# Patient Record
Sex: Female | Born: 2000 | Race: Black or African American | Hispanic: No | Marital: Single | Smoking: Current every day smoker
Health system: Southern US, Community
[De-identification: ages and names within clinical notes are randomized; demographics above are authoritative.]

## PROBLEM LIST (undated history)

## (undated) DIAGNOSIS — F32A Depression, unspecified: Secondary | ICD-10-CM

## (undated) HISTORY — DX: Depression, unspecified: F32.A

---
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
## 2018-10-21 ENCOUNTER — Other Ambulatory Visit: Payer: Self-pay

## 2018-10-21 ENCOUNTER — Ambulatory Visit (HOSPITAL_COMMUNITY)
Admission: EM | Admit: 2018-10-21 | Discharge: 2018-10-21 | Disposition: A | Discharge status: Home / Self Care | Payer: Medicaid Other | Attending: Emergency Medicine | Admitting: Emergency Medicine

## 2018-10-21 ENCOUNTER — Encounter (HOSPITAL_COMMUNITY): Payer: Self-pay

## 2018-10-21 DIAGNOSIS — R220 Localized swelling, mass and lump, head: Secondary | ICD-10-CM

## 2018-10-21 DIAGNOSIS — T7840XA Allergy, unspecified, initial encounter: Secondary | ICD-10-CM

## 2018-10-21 MED ORDER — PREDNISONE 10 MG (21) PO TBPK: ORAL_TABLET | Freq: Every day | ORAL | 0 refills | Status: DC

## 2018-10-21 MED ORDER — DIPHENHYDRAMINE HCL 25 MG PO TABS: 25.0000 mg | ORAL_TABLET | Freq: Four times a day (QID) | ORAL | 0 refills | Status: DC | PRN

## 2018-10-21 NOTE — ED Triage Notes (Signed)
Pt states her bottom lip has been swelling for 2 days. Pt states her mouth has been dry as well.

## 2018-10-21 NOTE — ED Provider Notes (Signed)
[facility]    CSN: [id number] Arrival date & time: 10/21/18  1420      History   Chief Complaint Chief Complaint  Patient presents with  . Oral Swelling    HPI Laura Parker is a 18 y.o. female.   Patient presents with swelling to her lower lip x2 days.  She denies difficulty swallowing or breathing.  She denies new lip products but states she does have a new facemask.  She denies rash, fever, chills, sore throat, shortness of breath, or other symptoms.  LMP: 09/27/2018.     The history is provided by the patient.    History reviewed. No pertinent past medical history.  There are no active problems to display for this patient.   History reviewed. No pertinent surgical history.  OB History   No obstetric history on file.      Home Medications    Prior to Admission medications   Medication Sig Start Date End Date Taking? Authorizing Provider  diphenhydrAMINE (BENADRYL) 25 MG tablet Take 1 tablet (25 mg total) by mouth every 6 (six) hours as needed. 10/21/18   Mickie Bail, NP  predniSONE (STERAPRED UNI-PAK 21 TAB) 10 MG (21) TBPK tablet Take by mouth daily. Take 6 tabs by mouth daily  for 1 day, then 5 tabs for 1 day, then 4 tabs for 1 day, then 3 tabs for 1 day, 2 tabs for 1 day, then 1 tab by mouth daily for 1 day 10/21/18   Mickie Bail, NP    Family History No family history on file.  Social History Social History   Tobacco Use  . Smoking status: Never Smoker  . Smokeless tobacco: Never Used  Substance Use Topics  . Alcohol use: Never    Frequency: Never  . Drug use: Never     Allergies   Patient has no known allergies.   Review of Systems Review of Systems  Constitutional: Negative for chills and fever.  HENT: Negative for ear pain, sore throat and trouble swallowing.   Eyes: Negative for pain and visual disturbance.  Respiratory: Negative for cough and shortness of breath.   Cardiovascular: Negative for chest pain and  palpitations.  Gastrointestinal: Negative for abdominal pain and vomiting.  Genitourinary: Negative for dysuria and hematuria.  Musculoskeletal: Negative for arthralgias and back pain.  Skin: Negative for color change and rash.  Neurological: Negative for seizures and syncope.  All other systems reviewed and are negative.    Physical Exam Triage Vital Signs ED Triage Vitals  Enc Vitals Group     BP 10/21/18 1508 106/66     Pulse Rate 10/21/18 1508 81     Resp 10/21/18 1508 16     Temp 10/21/18 1508 97.6 F (36.4 C)     Temp Source 10/21/18 1508 Oral     SpO2 10/21/18 1508 100 %     Weight 10/21/18 1506 120 lb (54.4 kg)     Height --      Head Circumference --      Peak Flow --      Pain Score 10/21/18 1506 7     Pain Loc --      Pain Edu? --      Excl. in GC? --    No data found.  Updated Vital Signs BP 106/66 (BP Location: Right Arm)   Pulse 81   Temp 97.6 F (36.4 C) (Oral)   Resp 16   Wt 120 lb (54.4 kg)  LMP 09/27/2018   SpO2 100%   Visual Acuity Right Eye Distance:   Left Eye Distance:   Bilateral Distance:    Right Eye Near:   Left Eye Near:    Bilateral Near:     Physical Exam Vitals signs and nursing note reviewed.  Constitutional:      General: She is not in acute distress.    Appearance: She is well-developed.  HENT:     Head: Normocephalic and atraumatic.     Nose: Nose normal.     Mouth/Throat:     Mouth: Mucous membranes are moist.     Pharynx: Oropharynx is clear.     Comments: Minor lower lip edema.  No difficulty swallowing.   Eyes:     Conjunctiva/sclera: Conjunctivae normal.  Neck:     Musculoskeletal: Neck supple.  Cardiovascular:     Rate and Rhythm: Normal rate and regular rhythm.     Heart sounds: No murmur.  Pulmonary:     Effort: Pulmonary effort is normal. No respiratory distress.     Breath sounds: Normal breath sounds. No stridor. No wheezing or rhonchi.     Comments: Good air movement.  Abdominal:     Palpations:  Abdomen is soft.     Tenderness: There is no abdominal tenderness.  Skin:    General: Skin is warm and dry.     Findings: No rash.  Neurological:     Mental Status: She is alert.      UC Treatments / Results  Labs (all labs ordered are listed, but only abnormal results are displayed) Labs Reviewed - No data to display  EKG   Radiology No results found.  Procedures Procedures (including critical care time)  Medications Ordered in UC Medications - No data to display  Initial Impression / Assessment and Plan / UC Course  I have reviewed the triage vital signs and the nursing notes.  Pertinent labs & imaging results that were available during my care of the patient were reviewed by me and considered in my medical decision making (see chart for details).     Lip swelling which is likely an allergic reaction to her new facemask.  Treating with prednisone and Benadryl.  Strict instructions given to patient to go to the emergency department if she has difficulty swallowing or breathing; or feels that her mouth or throat is swelling.  Instructed patient to return here if her swelling persist and is not improving with medications.  Patient agrees to plan of care.     Final Clinical Impressions(s) / UC Diagnoses   Final diagnoses:  Allergic reaction, initial encounter  Lip swelling     Discharge Instructions     Take the prednisone and Benadryl as directed.    Go to the emergency department if you feel like your mouth or throat is swelling;  Or if you have difficulty swallowing or difficulty breathing.    Return here if the swelling persists and is not improving with the medications.        ED Prescriptions    Medication Sig Dispense Auth. Provider   predniSONE (STERAPRED UNI-PAK 21 TAB) 10 MG (21) TBPK tablet Take by mouth daily. Take 6 tabs by mouth daily  for 1 day, then 5 tabs for 1 day, then 4 tabs for 1 day, then 3 tabs for 1 day, 2 tabs for 1 day, then 1 tab by  mouth daily for 1 day 21 tablet Mickie Bailate, Farha Dano H, NP   diphenhydrAMINE (BENADRYL)  25 MG tablet Take 1 tablet (25 mg total) by mouth every 6 (six) hours as needed. 30 tablet Sharion Balloon, NP     Controlled Substance Prescriptions [place] Controlled Substance Registry consulted? Not Applicable   Sharion Balloon, NP 10/21/18 [phone]

## 2018-10-21 NOTE — Discharge Instructions (Signed)
Take the prednisone and Benadryl as directed.    Go to the emergency department if you feel like your mouth or throat is swelling;  Or if you have difficulty swallowing or difficulty breathing.    Return here if the swelling persists and is not improving with the medications.

## 2019-09-06 ENCOUNTER — Ambulatory Visit (HOSPITAL_COMMUNITY)
Admission: EM | Admit: 2019-09-06 | Discharge: 2019-09-06 | Disposition: A | Discharge status: Home / Self Care | Payer: Medicaid Other | Attending: Family Medicine | Admitting: Family Medicine

## 2019-09-06 ENCOUNTER — Encounter (HOSPITAL_COMMUNITY): Payer: Self-pay

## 2019-09-06 ENCOUNTER — Other Ambulatory Visit: Payer: Self-pay

## 2019-09-06 DIAGNOSIS — B9789 Other viral agents as the cause of diseases classified elsewhere: Secondary | ICD-10-CM

## 2019-09-06 DIAGNOSIS — Z20822 Contact with and (suspected) exposure to covid-19: Secondary | ICD-10-CM | POA: Diagnosis not present

## 2019-09-06 DIAGNOSIS — R519 Headache, unspecified: Secondary | ICD-10-CM | POA: Diagnosis not present

## 2019-09-06 DIAGNOSIS — R05 Cough: Secondary | ICD-10-CM | POA: Diagnosis not present

## 2019-09-06 DIAGNOSIS — B349 Viral infection, unspecified: Secondary | ICD-10-CM | POA: Insufficient documentation

## 2019-09-06 DIAGNOSIS — F17210 Nicotine dependence, cigarettes, uncomplicated: Secondary | ICD-10-CM | POA: Insufficient documentation

## 2019-09-06 DIAGNOSIS — J028 Acute pharyngitis due to other specified organisms: Secondary | ICD-10-CM | POA: Insufficient documentation

## 2019-09-06 DIAGNOSIS — R5383 Other fatigue: Secondary | ICD-10-CM | POA: Diagnosis not present

## 2019-09-06 LAB — POCT RAPID STREP A: Streptococcus, Group A Screen (Direct): NEGATIVE

## 2019-09-06 LAB — SARS CORONAVIRUS 2 (TAT 6-24 HRS): SARS Coronavirus 2: NEGATIVE

## 2019-09-06 NOTE — ED Provider Notes (Signed)
[facility]   [id number] 09/06/19 Arrival Time: 1009   CC: COVID symptoms  SUBJECTIVE: History from: patient.  Laura Parker is a 19 y.o. female who presents with abrupt onset of headache and sore throat for the last 3 days. Denies sick exposure to COVID, flu or strep. Does report that her sister has tonsillitis at home. Denies Covid history, has not had Covid vaccines. Denies recent travel. Has not taken OTC medications for this. There are no aggravating or alleviating factors. Denies previous symptoms in the past. Denies fever, chills, fatigue, sinus pain, rhinorrhea, SOB, wheezing, chest pain, nausea, changes in bowel or bladder habits.    ROS: As per HPI.  All other pertinent ROS negative.     History reviewed. No pertinent past medical history. History reviewed. No pertinent surgical history. No Known Allergies No current facility-administered medications on file prior to encounter.   Current Outpatient Medications on File Prior to Encounter  Medication Sig Dispense Refill  . ibuprofen (ADVIL) 200 MG tablet Take 200 mg by mouth every 6 (six) hours as needed for mild pain or moderate pain.    . diphenhydrAMINE (BENADRYL) 25 MG tablet Take 1 tablet (25 mg total) by mouth every 6 (six) hours as needed. 30 tablet 0  . predniSONE (STERAPRED UNI-PAK 21 TAB) 10 MG (21) TBPK tablet Take by mouth daily. Take 6 tabs by mouth daily  for 1 day, then 5 tabs for 1 day, then 4 tabs for 1 day, then 3 tabs for 1 day, 2 tabs for 1 day, then 1 tab by mouth daily for 1 day 21 tablet 0   Social History   Socioeconomic History  . Marital status: Single    Spouse name: Not on file  . Number of children: Not on file  . Years of education: Not on file  . Highest education level: Not on file  Occupational History  . Not on file  Tobacco Use  . Smoking status: Current Every Day Smoker    Types: Cigars  . Smokeless tobacco: Never Used  Vaping Use  . Vaping Use: Never used    Substance and Sexual Activity  . Alcohol use: Never  . Drug use: Never  . Sexual activity: Not Currently  Other Topics Concern  . Not on file  Social History Narrative  . Not on file   Social Determinants of Health   Financial Resource Strain:   . Difficulty of Paying Living Expenses:   Food Insecurity:   . Worried About Programme researcher, broadcasting/film/video in the Last Year:   . Barista in the Last Year:   Transportation Needs:   . Freight forwarder (Medical):   Marland Kitchen Lack of Transportation (Non-Medical):   Physical Activity:   . Days of Exercise per Week:   . Minutes of Exercise per Session:   Stress:   . Feeling of Stress :   Social Connections:   . Frequency of Communication with Friends and Family:   . Frequency of Social Gatherings with Friends and Family:   . Attends Religious Services:   . Active Member of Clubs or Organizations:   . Attends Banker Meetings:   Marland Kitchen Marital Status:   Intimate Partner Violence:   . Fear of Current or Ex-Partner:   . Emotionally Abused:   Marland Kitchen Physically Abused:   . Sexually Abused:    History reviewed. No pertinent family history.  OBJECTIVE:  Vitals:   09/06/19 1058  BP: (!) 108/63  Pulse: 74  Resp: 16  Temp: 98.3 F (36.8 C)  TempSrc: Oral  SpO2: 100%     General appearance: alert; appears fatigued, but nontoxic; speaking in full sentences and tolerating own secretions HEENT: NCAT; Ears: EACs clear, TMs pearly gray; Eyes: PERRL.  EOM grossly intact. Sinuses: nontender; Nose: nares patent without rhinorrhea, Throat: oropharynx clear, tonsils erythematous +1, no exudates, uvula midline  Neck: supple without LAD Lungs: unlabored respirations, symmetrical air entry; cough: absent; no respiratory distress; CTAB Heart: regular rate and rhythm.  Radial pulses 2+ symmetrical bilaterally Skin: warm and dry Psychological: alert and cooperative; normal mood and affect  LABS:  Results for orders placed or performed during  the hospital encounter of 09/06/19 (from the past 24 hour(s))  POCT rapid strep A [facility])     Status: None   Collection Time: 09/06/19 11:32 AM  Result Value Ref Range   Streptococcus, Group A Screen (Direct) NEGATIVE NEGATIVE     ASSESSMENT & PLAN:  1. Viral illness   2. Nonintractable headache, unspecified chronicity pattern, unspecified headache type   3. Sore throat (viral)   4. Other fatigue    Rapid strep negative. Will culture. Will inform of positive results and treat accordingly   COVID testing ordered.  It will take between 1-2 days for test results.  Someone will contact you regarding abnormal results.    Patient should remain in quarantine until they have received Covid results.  If negative you may resume normal activities (go back to work/school) while practicing hand hygiene, social distance, and mask wearing.  If positive, patient should remain in quarantine for 10 days from symptom onset AND greater than 72 hours after symptoms resolution (absence of fever without the use of fever-reducing medication and improvement in respiratory symptoms), whichever is longer Get plenty of rest and push fluids Use OTC zyrtec for nasal congestion, runny nose, and/or sore throat Use OTC flonase for nasal congestion and runny nose Use medications daily for symptom relief Use OTC medications like ibuprofen or tylenol as needed fever or pain Call or go to the ED if you have any new or worsening symptoms such as fever, worsening cough, shortness of breath, chest tightness, chest pain, turning blue, changes in mental status.  Reviewed expectations re: course of current medical issues. Questions answered. Outlined signs and symptoms indicating need for more acute intervention. Patient verbalized understanding. After Visit Summary given.         Moshe Cipro, NP 09/06/19 1140

## 2019-09-06 NOTE — Discharge Instructions (Signed)
Your rapid strep test is negative.  A throat culture is pending; we will call you if it is positive requiring treatment.    Your COVID test is pending.  You should self quarantine until the test result is back.    Take Tylenol as needed for fever or discomfort.  Rest and keep yourself hydrated.    Go to the emergency department if you develop acute worsening symptoms.     

## 2019-09-06 NOTE — ED Triage Notes (Signed)
Patient is here today with complaints of a headache(x1 week), non productive cough(x 2 days) and scratchy throat(x2 days). Patient states she shares a bedroom with a younger sister that has been Dx tonsillitis or Strep Throat. Patient states she has tried OTC Advil and gargling with warm salt water with no relief,

## 2019-09-08 LAB — CULTURE, GROUP A STREP (THRC)

## 2020-02-15 ENCOUNTER — Encounter (HOSPITAL_COMMUNITY): Payer: Self-pay

## 2020-02-15 ENCOUNTER — Ambulatory Visit (HOSPITAL_COMMUNITY)
Admission: EM | Admit: 2020-02-15 | Discharge: 2020-02-15 | Disposition: A | Discharge status: Home / Self Care | Payer: Medicaid Other | Attending: Family Medicine | Admitting: Family Medicine

## 2020-02-15 ENCOUNTER — Other Ambulatory Visit: Payer: Self-pay

## 2020-02-15 DIAGNOSIS — Z20822 Contact with and (suspected) exposure to covid-19: Secondary | ICD-10-CM

## 2020-02-15 DIAGNOSIS — J069 Acute upper respiratory infection, unspecified: Secondary | ICD-10-CM | POA: Insufficient documentation

## 2020-02-15 DIAGNOSIS — U071 COVID-19: Secondary | ICD-10-CM | POA: Diagnosis not present

## 2020-02-15 LAB — SARS CORONAVIRUS 2 (TAT 6-24 HRS): SARS Coronavirus 2: POSITIVE — AB

## 2020-02-15 NOTE — ED Provider Notes (Signed)
  [facility]   [id number] 02/15/20 Arrival Time: 1112  ASSESSMENT & PLAN:  1. Acute upper respiratory infection   2. Exposure to COVID-19 virus     Overall feeling better. COVID-19 testing sent. See letter/work note on file for self-isolation guidelines. OTC symptom care as needed.    Follow-up Information     Urgent Care at [facility].   Specialty: Urgent Care Why: As needed. Contact information: [address] [place] [address] [phone]              Reviewed expectations re: course of current medical issues. Questions answered. Outlined signs and symptoms indicating need for more acute intervention. Understanding verbalized. After Visit Summary given.   SUBJECTIVE: History from: patient. Laura Parker is a 20 y.o. female who presents with worries regarding COVID-19. Known COVID-19 contact: reports recent exposure. Recent travel: none. Reports: HA, fatigue. Initially with n/v but that has resolved. Nasal congestion continuing.  Denies: fever and difficulty breathing. Normal PO intake without n/v/d.    OBJECTIVE:  Vitals:   02/15/20 1203  BP: 135/80  Pulse: 82  Resp: 17  Temp: 98.5 F (36.9 C)  TempSrc: Oral  SpO2: 100%    General appearance: alert; no distress Eyes: PERRLA; EOMI; conjunctiva normal HENT: [place]; AT; with nasal congestion Neck: supple  Lungs: speaks full sentences without difficulty; unlabored Extremities: no edema Skin: warm and dry Neurologic: normal gait Psychological: alert and cooperative; normal mood and affect  Labs:  Labs Reviewed  SARS CORONAVIRUS 2 (TAT 6-24 HRS)     No Known Allergies  History reviewed. No pertinent past medical history. Social History   Socioeconomic History  . Marital status: Single    Spouse name: Not on file  . Number of children: Not on file  . Years of education: Not on file  . Highest education level: Not on file  Occupational History  .  Not on file  Tobacco Use  . Smoking status: Current Every Day Smoker    Types: Cigars  . Smokeless tobacco: Never Used  Vaping Use  . Vaping Use: Never used  Substance and Sexual Activity  . Alcohol use: Never  . Drug use: Never  . Sexual activity: Not Currently  Other Topics Concern  . Not on file  Social History Narrative  . Not on file   Social Determinants of Health   Financial Resource Strain: Not on file  Food Insecurity: Not on file  Transportation Needs: Not on file  Physical Activity: Not on file  Stress: Not on file  Social Connections: Not on file  Intimate Partner Violence: Not on file   History reviewed. No pertinent family history. History reviewed. No pertinent surgical history.   Mardella Layman, MD 02/15/20 1344

## 2020-02-15 NOTE — ED Triage Notes (Signed)
Pt I with c/o emesis, fatigue and headache that started on 12/31, but has now resolved  States she has cough and st now  Pt has had tylenol for sxs with some relief

## 2020-02-15 NOTE — Discharge Instructions (Signed)
You have been tested for COVID-19 today. °If your test returns positive, you will receive a phone call from [facility] regarding your results. °Negative test results are not called. °Both positive and negative results area always visible on MyChart. °If you do not have a MyChart account, sign up instructions are provided in your discharge papers. °Please do not hesitate to contact us should you have questions or concerns. ° °

## 2020-04-19 ENCOUNTER — Ambulatory Visit (HOSPITAL_COMMUNITY): Payer: Self-pay

## 2020-04-21 ENCOUNTER — Ambulatory Visit (HOSPITAL_COMMUNITY)
Admission: EM | Admit: 2020-04-21 | Discharge: 2020-04-21 | Disposition: A | Discharge status: Home / Self Care | Payer: Medicaid Other | Attending: Emergency Medicine | Admitting: Emergency Medicine

## 2020-04-21 ENCOUNTER — Encounter (HOSPITAL_COMMUNITY): Payer: Self-pay

## 2020-04-21 ENCOUNTER — Other Ambulatory Visit: Payer: Self-pay

## 2020-04-21 DIAGNOSIS — Z202 Contact with and (suspected) exposure to infections with a predominantly sexual mode of transmission: Secondary | ICD-10-CM | POA: Diagnosis not present

## 2020-04-21 DIAGNOSIS — K5900 Constipation, unspecified: Secondary | ICD-10-CM | POA: Diagnosis not present

## 2020-04-21 DIAGNOSIS — R1084 Generalized abdominal pain: Secondary | ICD-10-CM | POA: Diagnosis not present

## 2020-04-21 LAB — POCT URINALYSIS DIPSTICK, ED / UC
Bilirubin Urine: NEGATIVE
Glucose, UA: NEGATIVE mg/dL
Ketones, ur: NEGATIVE mg/dL
Leukocytes,Ua: NEGATIVE
Nitrite: NEGATIVE
Protein, ur: NEGATIVE mg/dL
Specific Gravity, Urine: 1.025 (ref 1.005–1.030)
Urobilinogen, UA: 0.2 mg/dL (ref 0.0–1.0)
pH: 6 (ref 5.0–8.0)

## 2020-04-21 LAB — CERVICOVAGINAL ANCILLARY ONLY
Comment: NEGATIVE
Trichomonas: POSITIVE — AB

## 2020-04-21 MED ORDER — DOCUSATE SODIUM 100 MG PO CAPS: 100.0000 mg | ORAL_CAPSULE | Freq: Two times a day (BID) | ORAL | 0 refills | Status: DC

## 2020-04-21 MED ORDER — DOXYCYCLINE HYCLATE 100 MG PO CAPS: 100.0000 mg | ORAL_CAPSULE | Freq: Two times a day (BID) | ORAL | 0 refills | Status: DC

## 2020-04-21 MED ORDER — POLYETHYLENE GLYCOL 3350 17 G PO PACK: 17.0000 g | PACK | Freq: Every day | ORAL | 0 refills | Status: DC | PRN

## 2020-04-21 MED ORDER — METRONIDAZOLE 500 MG PO TABS: 500.0000 mg | ORAL_TABLET | Freq: Two times a day (BID) | ORAL | 0 refills | Status: DC

## 2020-04-21 NOTE — Discharge Instructions (Addendum)
For moderate to severe constipation (not having a bowel movement in more than 3 days) then try to use Miralax or an enema once daily until you have a good bowel movement.  It is not a good idea to use laxatives regularly, for instance daily.  A medication you could use daily to help with promoting bowel movements is docusate (Colace) 100mg . It is okay to use this 1-2 times daily as needed as a stool softener.  Try to stay active physically including regular exercise 2-3 times a week.  Make sure you hydrate well every day with about 64 ounces of water daily (that is 2 liters).  Try to avoid carb heavy foods, dairy. This includes cutting out breads, pasta, pizza, pastries, potatoes, rice, starchy foods in general. Eat more fiber as listed below:  Salads - kale, spinach, cabbage, spring mix; use seeds like pumpkin seeds or sunflower seeds, almonds; you can also use 1-2 hard boiled eggs in your salads Fruits - avocadoes, berries (blueberries, raspberries, blackberries), apples, oranges, pomegranate, grapefruit Vegetables - aspargus, cauliflower, broccoli, green beans, brussel spouts, bell peppers; stay away from starchy vegetables like potatoes, carrots, peas  Do not eat any foods on this list that you are allergic to.

## 2020-04-21 NOTE — ED Triage Notes (Signed)
Pt presents with abdominal pain x 1 month and states she has had exposure to a partner that has an STD. Pt states she has been constipated.

## 2020-04-21 NOTE — ED Provider Notes (Signed)
[facility]   MRN: [id number] DOB: 2000-11-08  Subjective:   Laura Parker is a 20 y.o. female presenting for exposure to chlamydia and trichomoniasis. Has sex with females only but does have oral and vaginal sex. Has had 1 month history of mild intermittent crampy abdominal pain. Has a hard time with constipation. Has about 1 bowel movement every other 1-2 days. Denies fever, n/v, pelvic pain, vaginal discharge, throat pain, dysuria. No genital rashes.   No current facility-administered medications for this encounter.  Current Outpatient Medications:  .  diphenhydrAMINE (BENADRYL) 25 MG tablet, Take 1 tablet (25 mg total) by mouth every 6 (six) hours as needed., Disp: 30 tablet, Rfl: 0 .  ibuprofen (ADVIL) 200 MG tablet, Take 200 mg by mouth every 6 (six) hours as needed for mild pain or moderate pain., Disp: , Rfl:  .  predniSONE (STERAPRED UNI-PAK 21 TAB) 10 MG (21) TBPK tablet, Take by mouth daily. Take 6 tabs by mouth daily  for 1 day, then 5 tabs for 1 day, then 4 tabs for 1 day, then 3 tabs for 1 day, 2 tabs for 1 day, then 1 tab by mouth daily for 1 day, Disp: 21 tablet, Rfl: 0   No Known Allergies  History reviewed. No pertinent past medical history.   History reviewed. No pertinent surgical history.  History reviewed. No pertinent family history.  Social History   Tobacco Use  . Smoking status: Current Every Day Smoker    Types: Cigars  . Smokeless tobacco: Never Used  Vaping Use  . Vaping Use: Never used  Substance Use Topics  . Alcohol use: Never  . Drug use: Never    ROS   Objective:   Vitals: BP 121/68 (BP Location: Left Arm)   Pulse 86   Temp 98.1 F (36.7 C) (Oral)   Resp 17   LMP 04/05/2020 (Approximate)   SpO2 100%   Physical Exam Constitutional:      General: She is not in acute distress.    Appearance: Normal appearance. She is well-developed. She is not ill-appearing, toxic-appearing or diaphoretic.  HENT:      Head: Normocephalic and atraumatic.     Nose: Nose normal.     Mouth/Throat:     Mouth: Mucous membranes are moist.     Pharynx: Oropharynx is clear.  Eyes:     General: No scleral icterus.       Right eye: No discharge.        Left eye: No discharge.     Extraocular Movements: Extraocular movements intact.     Conjunctiva/sclera: Conjunctivae normal.     Pupils: Pupils are equal, round, and reactive to light.  Cardiovascular:     Rate and Rhythm: Normal rate.  Pulmonary:     Effort: Pulmonary effort is normal.  Abdominal:     General: Bowel sounds are normal. There is no distension.     Palpations: Abdomen is soft. There is no mass.     Tenderness: There is no abdominal tenderness. There is no right CVA tenderness, left CVA tenderness, guarding or rebound.  Skin:    General: Skin is warm and dry.  Neurological:     General: No focal deficit present.     Mental Status: She is alert and oriented to person, place, and time.  Psychiatric:        Mood and Affect: Mood normal.        Behavior: Behavior normal.  Thought Content: Thought content normal.        Judgment: Judgment normal.     Results for orders placed or performed during the hospital encounter of 04/21/20 (from the past 24 hour(s))  POC Urinalysis dipstick     Status: Abnormal   Collection Time: 04/21/20 12:35 PM  Result Value Ref Range   Glucose, UA NEGATIVE NEGATIVE mg/dL   Bilirubin Urine NEGATIVE NEGATIVE   Ketones, ur NEGATIVE NEGATIVE mg/dL   Specific Gravity, Urine 1.025 1.005 - 1.030   Hgb urine dipstick TRACE (A) NEGATIVE   pH 6.0 5.0 - 8.0   Protein, ur NEGATIVE NEGATIVE mg/dL   Urobilinogen, UA 0.2 0.0 - 1.0 mg/dL   Nitrite NEGATIVE NEGATIVE   Leukocytes,Ua NEGATIVE NEGATIVE    Assessment and Plan :   PDMP not reviewed this encounter.  1. Exposure to STD   2. Exposure to chlamydia   3. Exposure to trichomonas   4. Generalized abdominal pain   5. Constipation, unspecified constipation  type     Will cover empirically for chlamydia, trichomoniasis. Labs pending. Deferred UPT as she only has sex with females. Discussed general management of constipation and emphasized need for healthy diet as patient does not eat fiber. Start docusate, use Miralax prn. Counseled patient on potential for adverse effects with medications prescribed/recommended today, ER and return-to-clinic precautions discussed, patient verbalized understanding.    Wallis Bamberg, PA-C 04/21/20 1326

## 2020-04-22 LAB — CERVICOVAGINAL ANCILLARY ONLY
Bacterial Vaginitis (gardnerella): POSITIVE — AB
Candida Glabrata: NEGATIVE
Candida Vaginitis: NEGATIVE
Chlamydia: NEGATIVE
Comment: NEGATIVE
Comment: NEGATIVE
Comment: NEGATIVE
Comment: NEGATIVE
Comment: NORMAL
Neisseria Gonorrhea: NEGATIVE

## 2020-05-04 ENCOUNTER — Other Ambulatory Visit: Payer: Self-pay

## 2020-05-04 ENCOUNTER — Ambulatory Visit (HOSPITAL_COMMUNITY)
Admission: AD | Admit: 2020-05-04 | Discharge: 2020-05-04 | Disposition: A | Discharge status: Home / Self Care | Payer: Medicaid Other | Attending: Psychiatry | Admitting: Psychiatry

## 2020-05-04 ENCOUNTER — Ambulatory Visit (HOSPITAL_COMMUNITY)
Admission: EM | Admit: 2020-05-04 | Discharge: 2020-05-05 | Disposition: A | Discharge status: Home / Self Care | Payer: Medicaid Other | Attending: Psychiatry | Admitting: Psychiatry

## 2020-05-04 DIAGNOSIS — Z634 Disappearance and death of family member: Secondary | ICD-10-CM | POA: Insufficient documentation

## 2020-05-04 DIAGNOSIS — Z9152 Personal history of nonsuicidal self-harm: Secondary | ICD-10-CM | POA: Insufficient documentation

## 2020-05-04 DIAGNOSIS — F332 Major depressive disorder, recurrent severe without psychotic features: Secondary | ICD-10-CM | POA: Insufficient documentation

## 2020-05-04 DIAGNOSIS — Z20822 Contact with and (suspected) exposure to covid-19: Secondary | ICD-10-CM | POA: Insufficient documentation

## 2020-05-04 DIAGNOSIS — F17290 Nicotine dependence, other tobacco product, uncomplicated: Secondary | ICD-10-CM | POA: Insufficient documentation

## 2020-05-04 DIAGNOSIS — Z560 Unemployment, unspecified: Secondary | ICD-10-CM | POA: Insufficient documentation

## 2020-05-04 DIAGNOSIS — Z599 Problem related to housing and economic circumstances, unspecified: Secondary | ICD-10-CM | POA: Insufficient documentation

## 2020-05-04 DIAGNOSIS — F1290 Cannabis use, unspecified, uncomplicated: Secondary | ICD-10-CM | POA: Insufficient documentation

## 2020-05-04 LAB — CBC WITH DIFFERENTIAL/PLATELET
Abs Immature Granulocytes: 0.01 10*3/uL (ref 0.00–0.07)
Basophils Absolute: 0 10*3/uL (ref 0.0–0.1)
Basophils Relative: 0 %
Eosinophils Absolute: 0 10*3/uL (ref 0.0–0.5)
Eosinophils Relative: 0 %
HCT: 36.8 % (ref 36.0–46.0)
Hemoglobin: 12.8 g/dL (ref 12.0–15.0)
Immature Granulocytes: 0 %
Lymphocytes Relative: 39 %
Lymphs Abs: 2.2 10*3/uL (ref 0.7–4.0)
MCH: 32.9 pg (ref 26.0–34.0)
MCHC: 34.8 g/dL (ref 30.0–36.0)
MCV: 94.6 fL (ref 80.0–100.0)
Monocytes Absolute: 0.4 10*3/uL (ref 0.1–1.0)
Monocytes Relative: 7 %
Neutro Abs: 2.9 10*3/uL (ref 1.7–7.7)
Neutrophils Relative %: 54 %
Platelets: 182 10*3/uL (ref 150–400)
RBC: 3.89 MIL/uL (ref 3.87–5.11)
RDW: 12.6 % (ref 11.5–15.5)
WBC: 5.5 10*3/uL (ref 4.0–10.5)
nRBC: 0 % (ref 0.0–0.2)

## 2020-05-04 LAB — POCT URINE DRUG SCREEN - MANUAL ENTRY (I-SCREEN)
POC Amphetamine UR: NOT DETECTED
POC Buprenorphine (BUP): NOT DETECTED
POC Cocaine UR: NOT DETECTED
POC Marijuana UR: POSITIVE — AB
POC Methadone UR: NOT DETECTED
POC Methamphetamine UR: NOT DETECTED
POC Morphine: NOT DETECTED
POC Oxazepam (BZO): NOT DETECTED
POC Oxycodone UR: NOT DETECTED
POC Secobarbital (BAR): NOT DETECTED

## 2020-05-04 LAB — COMPREHENSIVE METABOLIC PANEL
ALT: 18 U/L (ref 0–44)
AST: 18 U/L (ref 15–41)
Albumin: 4.2 g/dL (ref 3.5–5.0)
Alkaline Phosphatase: 31 U/L — ABNORMAL LOW (ref 38–126)
Anion gap: 9 (ref 5–15)
BUN: 11 mg/dL (ref 6–20)
CO2: 22 mmol/L (ref 22–32)
Calcium: 9.2 mg/dL (ref 8.9–10.3)
Chloride: 106 mmol/L (ref 98–111)
Creatinine, Ser: 0.74 mg/dL (ref 0.44–1.00)
GFR, Estimated: >60 mL/min (ref >60)
Glucose, Bld: 62 mg/dL — ABNORMAL LOW (ref 70–99)
Potassium: 4.1 mmol/L (ref 3.5–5.1)
Sodium: 137 mmol/L (ref 135–145)
Total Bilirubin: 0.8 mg/dL (ref 0.3–1.2)
Total Protein: 6.8 g/dL (ref 6.5–8.1)

## 2020-05-04 LAB — RESP PANEL BY RT-PCR (FLU A&B, COVID) ARPGX2
Influenza A by PCR: NEGATIVE
Influenza B by PCR: NEGATIVE
SARS Coronavirus 2 by RT PCR: NEGATIVE

## 2020-05-04 LAB — POCT PREGNANCY, URINE: Preg Test, Ur: NEGATIVE

## 2020-05-04 LAB — TSH: TSH: 3.741 u[IU]/mL (ref 0.350–4.500)

## 2020-05-04 LAB — ETHANOL: Alcohol, Ethyl (B): <10 mg/dL (ref <10)

## 2020-05-04 LAB — POC SARS CORONAVIRUS 2 AG -  ED: SARS Coronavirus 2 Ag: NEGATIVE

## 2020-05-04 MED ORDER — SERTRALINE HCL 50 MG PO TABS
50.0000 mg | ORAL_TABLET | Freq: Every day | ORAL | Status: DC
  Administered 2020-05-04 – 2020-05-05 (×2): 50 mg via ORAL
  Filled 2020-05-04 (×2): qty 1

## 2020-05-04 MED ORDER — MAGNESIUM HYDROXIDE 400 MG/5ML PO SUSP: 30.0000 mL | Freq: Every day | ORAL | Status: DC | PRN

## 2020-05-04 MED ORDER — ALUM & MAG HYDROXIDE-SIMETH 200-200-20 MG/5ML PO SUSP: 30.0000 mL | ORAL | Status: DC | PRN

## 2020-05-04 MED ORDER — TRAZODONE HCL 50 MG PO TABS
50.0000 mg | ORAL_TABLET | Freq: Every evening | ORAL | Status: DC | PRN
  Administered 2020-05-04: 50 mg via ORAL
  Filled 2020-05-04: qty 1

## 2020-05-04 MED ORDER — HYDROXYZINE HCL 25 MG PO TABS
25.0000 mg | ORAL_TABLET | Freq: Three times a day (TID) | ORAL | Status: DC | PRN
  Administered 2020-05-04: 25 mg via ORAL
  Filled 2020-05-04: qty 1

## 2020-05-04 MED ORDER — ACETAMINOPHEN 325 MG PO TABS: 650.0000 mg | ORAL_TABLET | Freq: Four times a day (QID) | ORAL | Status: DC | PRN

## 2020-05-04 NOTE — H&P (Signed)
Behavioral Health Medical Screening Exam  Laura Parker is an 20 y.o. female who presented to [facility] as a walk-in, voluntarily. She stated she has been going through multiple life stressors; job loss, loss of apartment and a break-up, which have caused to become suicidal. She does not have a specific plan but did go to her cousin's house earlier today and thought about using his gun to end her life. She is living with her grandmother. She is tearful, sad, depressed, and stated she has lack of energy, poor sleep, poor appetite and feels hopeless. She Has never been hospitalized and never taken any medication for her depression. She is seeking help with medication and therapy. Patient has been accepted to [facility] for observation.   Total Time spent with patient: 30 minutes  Psychiatric Specialty Exam: Physical Exam Constitutional:      Appearance: Normal appearance.  HENT:     Head: Normocephalic.  Pulmonary:     Effort: Pulmonary effort is normal.  Musculoskeletal:        General: Normal range of motion.     Cervical back: Normal range of motion.  Neurological:     Mental Status: She is alert and oriented to person, place, and time.  Psychiatric:        Attention and Perception: Attention and perception normal. She is attentive. She does not perceive auditory hallucinations.        Mood and Affect: Mood is anxious and depressed.        Speech: Speech normal.        Behavior: Behavior is cooperative.        Cognition and Memory: Cognition normal.    Review of Systems  Constitutional: Negative.   HENT: Negative.   Respiratory: Negative.   Gastrointestinal: Negative.   Genitourinary: Negative.   Musculoskeletal: Negative.   Neurological: Negative.    Blood pressure 123/68, pulse 60, temperature 98.6 F (37 C), temperature source Oral, resp. rate 16, last menstrual period 04/05/2020, SpO2 100 %.There is no height or weight on file to calculate BMI. General Appearance: Casual and  Fairly Groomed Eye Contact:  Good Speech:  Clear and Coherent and Normal Rate Volume:  Decreased Mood:  Anxious, Depressed and Hopeless Affect:  Congruent and Depressed Thought Process:  Coherent, Goal Directed and Descriptions of Associations: Intact Orientation:  Full (Time, Place, and Person) Thought Content:  Logical and Hallucinations: None Suicidal Thoughts:  Yes.  without intent/plan Homicidal Thoughts:  No Memory:  Immediate;   Fair Recent;   Fair Remote;   Fair Judgement:  Fair Insight:  Fair Psychomotor Activity:  Decreased Concentration: Concentration: Fair and Attention Span: Fair Recall:  Good Fund of Knowledge:Good Language: Good Akathisia:  No Handed:  Right AIMS (if indicated):    Assets:  Communication Skills Housing Leisure Time Physical Health Resilience Sleep:     Musculoskeletal: Strength & Muscle Tone: within normal limits Gait & Station: normal Patient leans: N/A  Blood pressure 123/68, pulse 60, temperature 98.6 F (37 C), temperature source Oral, resp. rate 16, last menstrual period 04/05/2020, SpO2 100 %.  Recommendations: Based on my evaluation the patient does not appear to have an emergency medical condition. Patient accepted to [facility] for observation.   Laveda Abbe, NP 05/04/2020, 12:56 PM

## 2020-05-04 NOTE — ED Notes (Signed)
Patient not on unit at this time.
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
## 2020-05-04 NOTE — ED Notes (Signed)
Pt admitted to continuous assessment endorsing  SI no plan. Cooperative throughout assessment. Will monitor for safety.

## 2020-05-04 NOTE — ED Notes (Signed)
LOCKER 31 

## 2020-05-04 NOTE — Progress Notes (Signed)
Patient admitted to the [facility] for SI, oriented to unit offered food/ tolieting. Pain 0/10. Denies AVH. Nursing staff will monitor.

## 2020-05-04 NOTE — ED Notes (Signed)
Pt denies SI/HI at present. Sitting up in chair. Pleasant. Informed pt to notify staff with any needs or concerns. Safety maintained
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
## 2020-05-05 MED ORDER — ONDANSETRON HCL 4 MG PO TABS
4.0000 mg | ORAL_TABLET | Freq: Three times a day (TID) | ORAL | Status: DC | PRN
  Administered 2020-05-05: 4 mg via ORAL
  Filled 2020-05-05: qty 1

## 2020-05-05 MED ORDER — HYDROXYZINE HCL 25 MG PO TABS: 25.0000 mg | ORAL_TABLET | Freq: Three times a day (TID) | ORAL | 0 refills | Status: DC | PRN

## 2020-05-05 MED ORDER — ONDANSETRON HCL 4 MG PO TABS: 4.0000 mg | ORAL_TABLET | Freq: Three times a day (TID) | ORAL | 0 refills | Status: DC | PRN

## 2020-05-05 MED ORDER — SERTRALINE HCL 50 MG PO TABS
50.0000 mg | ORAL_TABLET | Freq: Every day | ORAL | 0 refills | Status: DC
Start: 2020-05-05 — End: 2021-11-23

## 2020-05-05 MED ORDER — TRAZODONE HCL 50 MG PO TABS: 50.0000 mg | ORAL_TABLET | Freq: Every evening | ORAL | 0 refills | Status: DC | PRN

## 2020-05-05 NOTE — ED Notes (Signed)
Pt sleeping at present, no distress noted, monitoring for safety. 

## 2020-05-05 NOTE — Discharge Summary (Signed)
Stann Ore Deharo to be D/C'd home per NP order. Discussed with the patient and all questions fully answered. An After Visit Summary was printed and given to the patient.  Patient escorted out and D/C home via safe transport.  Dickie La  05/05/2020 11:32 AM

## 2020-05-05 NOTE — Progress Notes (Signed)
Pt was observed vomiting. Provider was notified. 4mg  Zofran was ordered and administered. Ginger ale and crackers given. Pt is currently resting quietly. Safety maintained.

## 2020-05-05 NOTE — Progress Notes (Addendum)
Pt is alert and oriented. Pt had an episode of vomiting earlier. Per pt, she took meds on an empty stomach last night and that is upsetting her stomach. Pt was given ginger ale. Pt said she was feeling fine on reassessment. Pt was informed that Zofran will be requested for her N/V but she decline. Pt denies SI, HI and AVH at this time. Pt is currently sleeping. Staff will monitor for pt's safety.

## 2020-05-05 NOTE — Discharge Instructions (Signed)

## 2020-05-05 NOTE — ED Provider Notes (Signed)
FBC/OBS ASAP Discharge Summary  Date and Time: 05/05/2020 8:25 AM  Name: Laura Parker  MRN:  [id number]   Discharge Diagnoses:  Final diagnoses:  MDD (major depressive disorder), recurrent severe, without psychosis (HCC)    Subjective: Patient reports that she is feeling better today.  Patient denies any suicidal homicidal ideations and denies any hallucinations.  Patient states that she feels that she would like to continue the medications and follow-up with outpatient services.  She states that she would like to get into therapy because she feels that it would benefit her.  She states that she lives at home with her grandmother and gives consent to contact her grandmother, Bjorn Loser, at [phone]. Patient's grandmother reports that there are no safety concerns with the patient discharging home today.  She does state that she would like the patient have assistance with transportation home.  She reports that she will contact her at home on her patient back to the facility if needed.  Stay Summary: Patient is a 20 year old female presented to [facility] voluntarily as a walk-in with complaint of suicidal ideations and.  Patient was transferred to the [facility] for overnight assessment and observation.  Patient was started on Zoloft 50 mg p.o. daily, trazodone 50 mg p.o. nightly, and Vistaril 25 mg p.o. 3 times daily.  Patient reports today that she is feeling better and that she had no side effect from medications.  She denies nay suicidal or homicidal ideations and denies nay hallucinations. Outpatient services and to continue on her medications.  Patient was provided with 30-day prescriptions for her Zoloft, trazodone, and Vistaril.  Patient is also provided with open access information at the [facility].  Total Time spent with patient: 30 minutes  Past Psychiatric History: MDD, anxiety Past Medical History: No past medical history on file. No past surgical history on file. Family History: No family  history on file. Family Psychiatric History: None reported Social History:  Social History   Substance and Sexual Activity  Alcohol Use Never     Social History   Substance and Sexual Activity  Drug Use Never    Social History   Socioeconomic History  . Marital status: Single    Spouse name: Not on file  . Number of children: Not on file  . Years of education: Not on file  . Highest education level: Not on file  Occupational History  . Not on file  Tobacco Use  . Smoking status: Current Every Day Smoker    Types: Cigars  . Smokeless tobacco: Never Used  Vaping Use  . Vaping Use: Never used  Substance and Sexual Activity  . Alcohol use: Never  . Drug use: Never  . Sexual activity: Not Currently  Other Topics Concern  . Not on file  Social History Narrative  . Not on file   Social Determinants of Health   Financial Resource Strain: Not on file  Food Insecurity: Not on file  Transportation Needs: Not on file  Physical Activity: Not on file  Stress: Not on file  Social Connections: Not on file   SDOH:  SDOH Screenings   Alcohol Screen: Not on file  Depression (PHQ2-9): Medium Risk  . PHQ-2 Score: 13  Financial Resource Strain: Not on file  Food Insecurity: Not on file  Housing: Not on file  Physical Activity: Not on file  Social Connections: Not on file  Stress: Not on file  Tobacco Use: High Risk  . Smoking Tobacco Use: Current Every Day  Smoker  . Smokeless Tobacco Use: Never Used  Transportation Needs: Not on file    Has this patient used any form of tobacco in the last 30 days? (Cigarettes, Smokeless Tobacco, Cigars, and/or Pipes) A prescription for an FDA-approved tobacco cessation medication was offered at discharge and the patient refused  Current Medications:  Current Facility-Administered Medications  Medication Dose Route Frequency Provider Last Rate Last Admin  . acetaminophen (TYLENOL) tablet 650 mg  650 mg Oral Q6H PRN Money, Gerlene Burdock, FNP       . alum & mag hydroxide-simeth (MAALOX/MYLANTA) 200-200-20 MG/5ML suspension 30 mL  30 mL Oral Q4H PRN Money, Gerlene Burdock, FNP      . hydrOXYzine (ATARAX/VISTARIL) tablet 25 mg  25 mg Oral TID PRN Money, Gerlene Burdock, FNP   25 mg at 05/04/20 2244  . magnesium hydroxide (MILK OF MAGNESIA) suspension 30 mL  30 mL Oral Daily PRN Money, Gerlene Burdock, FNP      . sertraline (ZOLOFT) tablet 50 mg  50 mg Oral Daily Money, Gerlene Burdock, FNP   50 mg at 05/04/20 1812  . traZODone (DESYREL) tablet 50 mg  50 mg Oral QHS PRN Money, Gerlene Burdock, FNP   50 mg at 05/04/20 2244   Current Outpatient Medications  Medication Sig Dispense Refill  . ibuprofen (ADVIL) 200 MG tablet Take 200 mg by mouth every 6 (six) hours as needed for mild pain or moderate pain.    . hydrOXYzine (ATARAX/VISTARIL) 25 MG tablet Take 1 tablet (25 mg total) by mouth 3 (three) times daily as needed for anxiety. 30 tablet 0  . sertraline (ZOLOFT) 50 MG tablet Take 1 tablet (50 mg total) by mouth daily. 30 tablet 0  . traZODone (DESYREL) 50 MG tablet Take 1 tablet (50 mg total) by mouth at bedtime as needed for sleep. 30 tablet 0    PTA Medications: (Not in a hospital admission)   Musculoskeletal  Strength & Muscle Tone: within normal limits Gait & Station: normal Patient leans: N/A  Psychiatric Specialty Exam  Presentation  General Appearance: Appropriate for Environment; Casual  Eye Contact:Good  Speech:Clear and Coherent; Normal Rate  Speech Volume:Normal  Handedness:Right   Mood and Affect  Mood:Depressed  Affect:Appropriate; Congruent   Thought Process  Thought Processes:Coherent  Descriptions of Associations:Intact  Orientation:Full (Time, Place and Person)  Thought Content:WDL  Diagnosis of Schizophrenia or Schizoaffective disorder in past: No    Hallucinations:Hallucinations: None  Ideas of Reference:None  Suicidal Thoughts:Suicidal Thoughts: No  Homicidal Thoughts:Homicidal Thoughts: No   Sensorium   Memory:Immediate Good; Recent Good; Remote Good  Judgment:Good  Insight:Fair   Executive Functions  Concentration:Good  Attention Span:Good  Recall:Good  Fund of Knowledge:Good  Language:Good   Psychomotor Activity  Psychomotor Activity:Psychomotor Activity: Normal   Assets  Assets:Communication Skills; Desire for Improvement; Financial Resources/Insurance; Housing; Social Support; Physical Health   Sleep  Sleep:Sleep: Good   Nutritional Assessment (For OBS and FBC admissions only) Has the patient had a weight loss or gain of 10 pounds or more in the last 3 months?: No Has the patient had a decrease in food intake/or appetite?: No Does the patient have dental problems?: No Does the patient have eating habits or behaviors that may be indicators of an eating disorder including binging or inducing vomiting?: No Has the patient recently lost weight without trying?: No Has the patient been eating poorly because of a decreased appetite?: No Malnutrition Screening Tool Score: 0    Physical Exam  Physical Exam Vitals and  nursing note reviewed.  Constitutional:      Appearance: She is well-developed.  HENT:     Head: Normocephalic.  Eyes:     Pupils: Pupils are equal, round, and reactive to light.  Cardiovascular:     Rate and Rhythm: Normal rate.  Pulmonary:     Effort: Pulmonary effort is normal.  Musculoskeletal:        General: Normal range of motion.  Neurological:     Mental Status: She is alert and oriented to person, place, and time.    Review of Systems  Constitutional: Negative.   HENT: Negative.   Eyes: Negative.   Respiratory: Negative.   Cardiovascular: Negative.   Gastrointestinal: Negative.   Genitourinary: Negative.   Musculoskeletal: Negative.   Skin: Negative.   Neurological: Negative.   Endo/Heme/Allergies: Negative.   Psychiatric/Behavioral: Positive for depression. Negative for suicidal ideas.   Blood pressure 108/62, pulse 66,  temperature 97.7 F (36.5 C), temperature source Temporal, resp. rate 16, last menstrual period 04/05/2020, SpO2 100 %. There is no height or weight on file to calculate BMI.  Demographic Factors:  Adolescent or young adult  Loss Factors: NA  Historical Factors: NA  Risk Reduction Factors:   Sense of responsibility to family, Employed, Living with another person, especially a relative and Positive social support  Continued Clinical Symptoms:  Previous Psychiatric Diagnoses and Treatments  Cognitive Features That Contribute To Risk:  None    Suicide Risk:  Mild:  Suicidal ideation of limited frequency, intensity, duration, and specificity.  There are no identifiable plans, no associated intent, mild dysphoria and related symptoms, good self-control (both objective and subjective assessment), few other risk factors, and identifiable protective factors, including available and accessible social support.  Plan Of Care/Follow-up recommendations:  Continue activity as tolerated. Continue diet as recommended by your PCP. Ensure to keep all appointments with outpatient providers.  Disposition: Discharge home with grandmother  Maryfrances Bunnell, FNP 05/05/2020, 8:25 AM

## 2020-05-06 ENCOUNTER — Telehealth (HOSPITAL_COMMUNITY): Payer: Self-pay | Admitting: General Practice

## 2020-05-06 NOTE — BH Assessment (Signed)
Care Management - Follow Up [facility] Discharges   Writer attempted to make contact with patient today and was unsuccessful.  Patient voice mail is full.

## 2021-05-07 ENCOUNTER — Emergency Department (HOSPITAL_COMMUNITY)
Admission: EM | Admit: 2021-05-07 | Discharge: 2021-05-07 | Disposition: A | Discharge status: Home / Self Care | Payer: Medicaid Other | Attending: Emergency Medicine | Admitting: Emergency Medicine

## 2021-05-07 ENCOUNTER — Other Ambulatory Visit: Payer: Self-pay

## 2021-05-07 ENCOUNTER — Encounter (HOSPITAL_COMMUNITY): Payer: Self-pay | Admitting: Emergency Medicine

## 2021-05-07 ENCOUNTER — Emergency Department (HOSPITAL_COMMUNITY): Payer: Medicaid Other

## 2021-05-07 DIAGNOSIS — M7918 Myalgia, other site: Secondary | ICD-10-CM | POA: Diagnosis not present

## 2021-05-07 DIAGNOSIS — R059 Cough, unspecified: Secondary | ICD-10-CM | POA: Diagnosis present

## 2021-05-07 DIAGNOSIS — Z20822 Contact with and (suspected) exposure to covid-19: Secondary | ICD-10-CM | POA: Insufficient documentation

## 2021-05-07 DIAGNOSIS — R5383 Other fatigue: Secondary | ICD-10-CM | POA: Insufficient documentation

## 2021-05-07 DIAGNOSIS — R0981 Nasal congestion: Secondary | ICD-10-CM | POA: Diagnosis not present

## 2021-05-07 LAB — I-STAT BETA HCG BLOOD, ED (MC, WL, AP ONLY): I-stat hCG, quantitative: <5 m[IU]/mL (ref <5)

## 2021-05-07 LAB — RESP PANEL BY RT-PCR (FLU A&B, COVID) ARPGX2
Influenza A by PCR: NEGATIVE
Influenza B by PCR: NEGATIVE
SARS Coronavirus 2 by RT PCR: NEGATIVE

## 2021-05-07 MED ORDER — DM-GUAIFENESIN ER 30-600 MG PO TB12: 1.0000 | ORAL_TABLET | Freq: Two times a day (BID) | ORAL | 0 refills | Status: DC

## 2021-05-07 MED ORDER — PSEUDOEPHEDRINE HCL ER 120 MG PO TB12: 120.0000 mg | ORAL_TABLET | Freq: Every day | ORAL | 0 refills | Status: DC

## 2021-05-07 NOTE — ED Notes (Signed)
RN reviewed discharge instructions w/ pt. Follow up and prescriptions reviewed, pt had no further questions °

## 2021-05-07 NOTE — Discharge Instructions (Signed)
You have been seen and discharged from the emergency department.  Your x-ray showed no pneumonia.  Your flu and COVID swab is pending.  Use prescriptions to treat yourself symptomatically.  Stable hydrated.  Take Tylenol ibuprofen as needed.  Follow-up with your primary provider for further evaluation and further care. Take home medications as prescribed. If you have any worsening symptoms or further concerns for your health please return to an emergency department for further evaluation. ?

## 2021-05-07 NOTE — ED Provider Notes (Signed)
?[place] ?Provider Note ? ? ?CSN: [id number] ?Arrival date & time: 05/07/21  1646 ? ?  ? ?History ? ?Chief Complaint  ?Patient presents with  ? Chest Pain  ? Cough  ? ? ?Laura Parker is a 21 y.o. female. ? ?HPI ? ?20 year old female with no admitted past medical history presents emergency department with 5 days of nasal congestion, intermittently productive cough with yellow/clear phlegm, fatigue and myalgias.  Patient states she came in contact with someone who was COVID/flu positive.  Since then she has been experiencing generalized illness.  In the last day she has had a productive cough with nasal congestion.  Has not been taking any over-the-counter medication.  Denies any fever at home.  No active chest or back pain.  No swelling of her lower extremities.  No GI symptoms. ? ?Home Medications ?Prior to Admission medications   ?Medication Sig Start Date End Date Taking? Authorizing Provider  ?hydrOXYzine (ATARAX/VISTARIL) 25 MG tablet Take 1 tablet (25 mg total) by mouth 3 (three) times daily as needed for anxiety. 05/05/20   Money, Lowry Ram, FNP  ?ibuprofen (ADVIL) 200 MG tablet Take 200 mg by mouth every 6 (six) hours as needed for mild pain or moderate pain.    [provider]  ?ondansetron (ZOFRAN) 4 MG tablet Take 1 tablet (4 mg total) by mouth every 8 (eight) hours as needed for nausea or vomiting. 05/05/20   Money, Lowry Ram, FNP  ?sertraline (ZOLOFT) 50 MG tablet Take 1 tablet (50 mg total) by mouth daily. 05/05/20   Money, Lowry Ram, FNP  ?traZODone (DESYREL) 50 MG tablet Take 1 tablet (50 mg total) by mouth at bedtime as needed for sleep. 05/05/20   Money, Lowry Ram, FNP  ?   ? ?Allergies    ?Patient has no known allergies.   ? ?Review of Systems   ?Review of Systems  ?Constitutional:  Positive for fatigue. Negative for fever.  ?HENT:  Positive for congestion.   ?Respiratory:  Positive for cough. Negative for shortness of breath.   ?Cardiovascular:   Negative for chest pain and leg swelling.  ?Gastrointestinal:  Negative for abdominal pain, diarrhea and vomiting.  ?Musculoskeletal:  Positive for myalgias.  ?Skin:  Negative for rash.  ?Neurological:  Negative for headaches.  ? ?Physical Exam ?Updated Vital Signs ?BP 108/70   Pulse 60   Temp 98.3 ?F (36.8 ?C) (Oral)   Resp 12   LMP 05/07/2021   SpO2 100%  ?Physical Exam ?Vitals and nursing note reviewed.  ?Constitutional:   ?   General: She is not in acute distress. ?   Appearance: Normal appearance. She is not diaphoretic.  ?HENT:  ?   Head: Normocephalic.  ?   Mouth/Throat:  ?   Mouth: Mucous membranes are moist.  ?Cardiovascular:  ?   Rate and Rhythm: Normal rate.  ?Pulmonary:  ?   Effort: Pulmonary effort is normal. No respiratory distress.  ?   Breath sounds: Examination of the right-lower field reveals decreased breath sounds. Examination of the left-lower field reveals decreased breath sounds. Decreased breath sounds present.  ?Abdominal:  ?   Palpations: Abdomen is soft.  ?   Tenderness: There is no abdominal tenderness.  ?Musculoskeletal:  ?   Right lower leg: No edema.  ?   Left lower leg: No edema.  ?Skin: ?   General: Skin is warm.  ?Neurological:  ?   Mental Status: She is alert and oriented to person, place, and  time. Mental status is at baseline.  ?Psychiatric:     ?   Mood and Affect: Mood normal.  ? ? ?ED Results / Procedures / Treatments   ?Labs ?(all labs ordered are listed, but only abnormal results are displayed) ?Labs Reviewed  ?RESP PANEL BY RT-PCR (FLU A&B, COVID) ARPGX2  ?I-STAT BETA HCG BLOOD, ED ([facility], [facility], [facility] ONLY)  ? ? ?EKG ?None ? ?Radiology ?No results found. ? ?Procedures ?Procedures  ? ? ?Medications Ordered in ED ?Medications - No data to display ? ?ED Course/ Medical Decision Making/ A&P ?  ?                        ?Medical Decision Making ?Amount and/or Complexity of Data Reviewed ?Radiology: ordered. ? ?Risk ?OTC drugs. ? ? ?21 year old female presents with congestion,  intermittently productive cough. No fever, CP, back pain, leg swelling. Recent FLU and COVID exposure.  ? ?CXR neg for pneumonia, FLU/COV swab pending. Patient well appearing, perc negative. Plan for symptomatic outpatient treatment.  Patient at this time appears safe and stable for discharge and close outpatient follow up. Discharge plan and strict return to ED precautions discussed, patient verbalizes understanding and agreement. ? ? ? ? ? ? ? ?Final Clinical Impression(s) / ED Diagnoses ?Final diagnoses:  ?None  ? ? ?Rx / DC Orders ?ED Discharge Orders   ? ? None  ? ?  ? ? ?  ?Lorelle Gibbs, DO ?05/07/21 2201 ? ?

## 2021-05-07 NOTE — ED Triage Notes (Signed)
Pt reports chest pain, productive cough with yellow and clear phlegm, and runny nose since Tuesday. ?

## 2021-11-23 ENCOUNTER — Encounter (HOSPITAL_COMMUNITY): Payer: Self-pay | Admitting: Emergency Medicine

## 2021-11-23 ENCOUNTER — Ambulatory Visit (HOSPITAL_COMMUNITY)
Admission: EM | Admit: 2021-11-23 | Discharge: 2021-11-23 | Disposition: A | Discharge status: Home / Self Care | Payer: Medicaid Other | Attending: Emergency Medicine | Admitting: Emergency Medicine

## 2021-11-23 DIAGNOSIS — Z1152 Encounter for screening for COVID-19: Secondary | ICD-10-CM | POA: Diagnosis present

## 2021-11-23 DIAGNOSIS — R0981 Nasal congestion: Secondary | ICD-10-CM | POA: Insufficient documentation

## 2021-11-23 NOTE — ED Triage Notes (Signed)
Pt c/o nasal congestion for 2 days and was exposed to covid the other day so work told to get tested.

## 2021-11-23 NOTE — Discharge Instructions (Addendum)
We will call you if your covid test returns positive.   I recommend continuing mucinex twice daily for nasal congestion. Drink lots of fluids.

## 2021-11-23 NOTE — ED Provider Notes (Signed)
[place]    CSN: [id number] Arrival date & time: 11/23/21  1257     History   Chief Complaint Chief Complaint  Patient presents with   Nasal Congestion    HPI Laura Parker is a 21 y.o. female.  Presents for COVID testing Reports nasal congestion for 2 days close exposure to COVID-positive person, her work is requesting a test No fevers, sore throat, cough, GI symptoms  History reviewed. No pertinent past medical history.  Patient Active Problem List   Diagnosis Date Noted   Severe episode of recurrent major depressive disorder, without psychotic features ([place])     History reviewed. No pertinent surgical history.  OB History   No obstetric history on file.      Home Medications    Prior to Admission medications   Medication Sig Start Date End Date Taking? Authorizing Provider  ibuprofen (ADVIL) 200 MG tablet Take 200 mg by mouth every 6 (six) hours as needed for mild pain or moderate pain.    [provider]    Family History No family history on file.  Social History Social History   Tobacco Use   Smoking status: Every Day    Types: Cigars   Smokeless tobacco: Never  Vaping Use   Vaping Use: Never used  Substance Use Topics   Alcohol use: Never   Drug use: Never     Allergies   Patient has no known allergies.   Review of Systems Review of Systems Per HPI  Physical Exam Triage Vital Signs ED Triage Vitals [11/23/21 1417]  Enc Vitals Group     BP      Pulse      Resp      Temp      Temp src      SpO2      Weight      Height      Head Circumference      Peak Flow      Pain Score 0     Pain Loc      Pain Edu?      Excl. in [place]?    No data found.  Updated Vital Signs BP 115/74 (BP Location: Right Arm)   Pulse 67   Temp 98.1 F (36.7 C) (Oral)   Resp 15   LMP 11/06/2021   SpO2 98%   Physical Exam Vitals and nursing note reviewed.  Constitutional:      General: She is not in acute  distress. HENT:     Nose: Congestion present.     Mouth/Throat:     Mouth: Mucous membranes are moist.     Pharynx: Uvula midline. No posterior oropharyngeal erythema.     Tonsils: No tonsillar exudate or tonsillar abscesses.  Eyes:     Conjunctiva/sclera: Conjunctivae normal.  Cardiovascular:     Rate and Rhythm: Normal rate and regular rhythm.     Heart sounds: Normal heart sounds.  Pulmonary:     Effort: Pulmonary effort is normal.     Breath sounds: Normal breath sounds.  Musculoskeletal:     Cervical back: Normal range of motion.  Neurological:     Mental Status: She is alert and oriented to person, place, and time.      UC Treatments / Results  Labs (all labs ordered are listed, but only abnormal results are displayed) Labs Reviewed  SARS CORONAVIRUS 2 (TAT 6-24 HRS)    EKG  Radiology No results found.  Procedures Procedures (including  critical care time)  Medications Ordered in UC Medications - No data to display  Initial Impression / Assessment and Plan / UC Course  I have reviewed the triage vital signs and the nursing notes.  Pertinent labs & imaging results that were available during my care of the patient were reviewed by me and considered in my medical decision making (see chart for details).  Afebrile, well appearing here Recommend mucinex 600 mg BID for nasal congestion Covid test pending - if positive discussed other symptomatic care She will have to coordinate with work about quarantine if pos Return precautions discussed. Patient agrees to plan  Final Clinical Impressions(s) / UC Diagnoses   Final diagnoses:  Encounter for screening for COVID-19  Nasal congestion     Discharge Instructions      We will call you if your covid test returns positive.   I recommend continuing mucinex twice daily for nasal congestion. Drink lots of fluids.     ED Prescriptions   None    PDMP not reviewed this encounter.   Lorris Carducci, Lurena Joiner,  [place] 11/23/21 1432

## 2021-11-24 LAB — SARS CORONAVIRUS 2 (TAT 6-24 HRS): SARS Coronavirus 2: NEGATIVE

## 2022-04-30 ENCOUNTER — Emergency Department (HOSPITAL_COMMUNITY)
Admission: EM | Admit: 2022-04-30 | Discharge: 2022-04-30 | Discharge status: Against Medical Advice | Payer: Medicaid Other | Attending: Emergency Medicine | Admitting: Emergency Medicine

## 2022-04-30 ENCOUNTER — Other Ambulatory Visit: Payer: Self-pay

## 2022-04-30 DIAGNOSIS — X58XXXA Exposure to other specified factors, initial encounter: Secondary | ICD-10-CM | POA: Diagnosis not present

## 2022-04-30 DIAGNOSIS — S00521A Blister (nonthermal) of lip, initial encounter: Secondary | ICD-10-CM | POA: Insufficient documentation

## 2022-04-30 DIAGNOSIS — Z5321 Procedure and treatment not carried out due to patient leaving prior to being seen by health care provider: Secondary | ICD-10-CM | POA: Diagnosis not present

## 2022-04-30 NOTE — ED Triage Notes (Signed)
Patient reports blisters at corner of left side of lips onset last week .

## 2022-04-30 NOTE — ED Notes (Signed)
Sort tech stated pt came up to desk and said she was leaving

## 2022-04-30 NOTE — ED Provider Triage Note (Addendum)
Emergency Medicine Provider Triage Evaluation Note  Laura Parker , a 22 y.o. female  was evaluated in triage.  Pt complains of vesicles on the edge of her lip on the left side.  Patient states they have been present for a week and that they have sharp pain and want to be checked to make sure she does not have any STDs.  Patient has not been sexually active for the past 6 months.  Patient states she does not have any similar lesions in her mouth or on her genitals.  Patient states she wants to be tested for herpes.  Review of Systems  Positive: See HPI Negative: See HPI  Physical Exam  BP 123/70   Pulse 80   Temp 98.7 F (37.1 C) (Oral)   Resp 20   LMP 04/25/2022   SpO2 100%  Gen:   Awake, no distress   Resp:  Normal effort  MSK:   Moves extremities without difficulty  Other:  Vesicles noted on the left side of the patient's bottom lip  Medical Decision Making  Medically screening exam initiated at 9:14 PM.  Appropriate orders placed.  Laura Parker was informed that the remainder of the evaluation will be completed by another provider, this initial triage assessment does not replace that evaluation, and the importance of remaining in the ED until their evaluation is complete.  Workup initiated, patient able to stand   Elvina Sidle 04/30/22 2119

## 2022-05-02 LAB — HSV 1 ANTIBODY, IGG: HSV 1 Glycoprotein G Ab, IgG: 50.1 index — ABNORMAL HIGH (ref 0.00–0.90)

## 2023-05-19 ENCOUNTER — Inpatient Hospital Stay (HOSPITAL_COMMUNITY)
Admission: EM | Admit: 2023-05-19 | Discharge: 2023-05-22 | DRG: 512 | Disposition: A | Discharge status: Home / Self Care | Attending: Orthopedic Surgery | Admitting: Orthopedic Surgery

## 2023-05-19 ENCOUNTER — Other Ambulatory Visit: Payer: Self-pay

## 2023-05-19 ENCOUNTER — Encounter (HOSPITAL_COMMUNITY): Payer: Self-pay

## 2023-05-19 DIAGNOSIS — Z888 Allergy status to other drugs, medicaments and biological substances status: Secondary | ICD-10-CM

## 2023-05-19 DIAGNOSIS — F17210 Nicotine dependence, cigarettes, uncomplicated: Secondary | ICD-10-CM | POA: Diagnosis present

## 2023-05-19 DIAGNOSIS — S52351A Displaced comminuted fracture of shaft of radius, right arm, initial encounter for closed fracture: Principal | ICD-10-CM | POA: Diagnosis present

## 2023-05-19 DIAGNOSIS — Z23 Encounter for immunization: Secondary | ICD-10-CM

## 2023-05-19 DIAGNOSIS — S61411A Laceration without foreign body of right hand, initial encounter: Secondary | ICD-10-CM

## 2023-05-19 DIAGNOSIS — S52391A Other fracture of shaft of radius, right arm, initial encounter for closed fracture: Principal | ICD-10-CM

## 2023-05-19 DIAGNOSIS — S52201A Unspecified fracture of shaft of right ulna, initial encounter for closed fracture: Secondary | ICD-10-CM | POA: Diagnosis present

## 2023-05-19 DIAGNOSIS — S5291XA Unspecified fracture of right forearm, initial encounter for closed fracture: Secondary | ICD-10-CM | POA: Diagnosis present

## 2023-05-19 DIAGNOSIS — S0081XA Abrasion of other part of head, initial encounter: Secondary | ICD-10-CM | POA: Diagnosis present

## 2023-05-19 DIAGNOSIS — S8001XA Contusion of right knee, initial encounter: Secondary | ICD-10-CM | POA: Diagnosis present

## 2023-05-19 DIAGNOSIS — S52251A Displaced comminuted fracture of shaft of ulna, right arm, initial encounter for closed fracture: Secondary | ICD-10-CM | POA: Diagnosis present

## 2023-05-19 MED ORDER — HYDROMORPHONE HCL 1 MG/ML IJ SOLN
1.0000 mg | Freq: Once | INTRAMUSCULAR | Status: AC
  Administered 2023-05-20: 1 mg via INTRAVENOUS
  Filled 2023-05-19: qty 1
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
## 2023-05-20 ENCOUNTER — Observation Stay (HOSPITAL_COMMUNITY)

## 2023-05-20 ENCOUNTER — Emergency Department (HOSPITAL_COMMUNITY)

## 2023-05-20 ENCOUNTER — Observation Stay (HOSPITAL_COMMUNITY): Admitting: Anesthesiology

## 2023-05-20 ENCOUNTER — Encounter (HOSPITAL_COMMUNITY): Payer: Self-pay | Admitting: Orthopedic Surgery

## 2023-05-20 ENCOUNTER — Encounter (HOSPITAL_COMMUNITY)
Admission: EM | Disposition: A | Discharge status: Home / Self Care | Payer: Self-pay | Source: Home / Self Care | Attending: Emergency Medicine

## 2023-05-20 DIAGNOSIS — S52301A Unspecified fracture of shaft of right radius, initial encounter for closed fracture: Secondary | ICD-10-CM

## 2023-05-20 DIAGNOSIS — S61411A Laceration without foreign body of right hand, initial encounter: Secondary | ICD-10-CM

## 2023-05-20 DIAGNOSIS — S52201A Unspecified fracture of shaft of right ulna, initial encounter for closed fracture: Secondary | ICD-10-CM | POA: Diagnosis present

## 2023-05-20 DIAGNOSIS — S5291XA Unspecified fracture of right forearm, initial encounter for closed fracture: Secondary | ICD-10-CM | POA: Diagnosis not present

## 2023-05-20 DIAGNOSIS — S52351A Displaced comminuted fracture of shaft of radius, right arm, initial encounter for closed fracture: Secondary | ICD-10-CM | POA: Diagnosis not present

## 2023-05-20 DIAGNOSIS — Z888 Allergy status to other drugs, medicaments and biological substances status: Secondary | ICD-10-CM | POA: Diagnosis not present

## 2023-05-20 DIAGNOSIS — F17210 Nicotine dependence, cigarettes, uncomplicated: Secondary | ICD-10-CM | POA: Diagnosis not present

## 2023-05-20 DIAGNOSIS — S8001XA Contusion of right knee, initial encounter: Secondary | ICD-10-CM | POA: Diagnosis present

## 2023-05-20 DIAGNOSIS — Z23 Encounter for immunization: Secondary | ICD-10-CM | POA: Diagnosis not present

## 2023-05-20 DIAGNOSIS — S0081XA Abrasion of other part of head, initial encounter: Secondary | ICD-10-CM | POA: Diagnosis not present

## 2023-05-20 DIAGNOSIS — S52251A Displaced comminuted fracture of shaft of ulna, right arm, initial encounter for closed fracture: Secondary | ICD-10-CM | POA: Diagnosis not present

## 2023-05-20 HISTORY — PX: ORIF RADIAL FRACTURE: SHX5113

## 2023-05-20 LAB — CBC WITH DIFFERENTIAL/PLATELET
Abs Immature Granulocytes: 0.08 10*3/uL — ABNORMAL HIGH (ref 0.00–0.07)
Basophils Absolute: 0 10*3/uL (ref 0.0–0.1)
Basophils Relative: 0 %
Eosinophils Absolute: 0 10*3/uL (ref 0.0–0.5)
Eosinophils Relative: 0 %
HCT: 38.3 % (ref 36.0–46.0)
Hemoglobin: 13.1 g/dL (ref 12.0–15.0)
Immature Granulocytes: 1 %
Lymphocytes Relative: 14 %
Lymphs Abs: 1.7 10*3/uL (ref 0.7–4.0)
MCH: 32.8 pg (ref 26.0–34.0)
MCHC: 34.2 g/dL (ref 30.0–36.0)
MCV: 95.8 fL (ref 80.0–100.0)
Monocytes Absolute: 0.8 10*3/uL (ref 0.1–1.0)
Monocytes Relative: 6 %
Neutro Abs: 9.6 10*3/uL — ABNORMAL HIGH (ref 1.7–7.7)
Neutrophils Relative %: 79 %
Platelets: 197 10*3/uL (ref 150–400)
RBC: 4 MIL/uL (ref 3.87–5.11)
RDW: 13.3 % (ref 11.5–15.5)
WBC: 12.2 10*3/uL — ABNORMAL HIGH (ref 4.0–10.5)
nRBC: 0 % (ref 0.0–0.2)

## 2023-05-20 LAB — COMPREHENSIVE METABOLIC PANEL WITH GFR
ALT: 12 U/L (ref 0–44)
AST: 17 U/L (ref 15–41)
Albumin: 3.8 g/dL (ref 3.5–5.0)
Alkaline Phosphatase: 40 U/L (ref 38–126)
Anion gap: 9 (ref 5–15)
BUN: 12 mg/dL (ref 6–20)
CO2: 20 mmol/L — ABNORMAL LOW (ref 22–32)
Calcium: 8.6 mg/dL — ABNORMAL LOW (ref 8.9–10.3)
Chloride: 107 mmol/L (ref 98–111)
Creatinine, Ser: 0.69 mg/dL (ref 0.44–1.00)
GFR, Estimated: >60 mL/min (ref >60)
Glucose, Bld: 105 mg/dL — ABNORMAL HIGH (ref 70–99)
Potassium: 3.5 mmol/L (ref 3.5–5.1)
Sodium: 136 mmol/L (ref 135–145)
Total Bilirubin: 0.4 mg/dL (ref 0.0–1.2)
Total Protein: 6.8 g/dL (ref 6.5–8.1)

## 2023-05-20 LAB — TYPE AND SCREEN
ABO/RH(D): B POS
ABO/RH(D): B POS
Antibody Screen: NEGATIVE
Antibody Screen: NEGATIVE

## 2023-05-20 LAB — HCG, SERUM, QUALITATIVE: Preg, Serum: NEGATIVE

## 2023-05-20 LAB — HIV ANTIBODY (ROUTINE TESTING W REFLEX): HIV Screen 4th Generation wRfx: NONREACTIVE

## 2023-05-20 SURGERY — OPEN REDUCTION INTERNAL FIXATION (ORIF) RADIAL FRACTURE
Anesthesia: Monitor Anesthesia Care | Site: Arm Lower | Laterality: Right

## 2023-05-20 MED ORDER — ACETAMINOPHEN 500 MG PO TABS: 1000.0000 mg | ORAL_TABLET | Freq: Once | ORAL | Status: DC

## 2023-05-20 MED ORDER — TRANEXAMIC ACID-NACL 1000-0.7 MG/100ML-% IV SOLN
1000.0000 mg | INTRAVENOUS | Status: AC
  Administered 2023-05-20: 1000 mg via INTRAVENOUS
  Filled 2023-05-20: qty 100

## 2023-05-20 MED ORDER — HYDROMORPHONE HCL 1 MG/ML IJ SOLN
1.0000 mg | Freq: Once | INTRAMUSCULAR | Status: AC
  Administered 2023-05-20: 1 mg via INTRAVENOUS
  Filled 2023-05-20: qty 1

## 2023-05-20 MED ORDER — MIDAZOLAM HCL 2 MG/2ML IJ SOLN
INTRAMUSCULAR | Status: AC
Start: 2023-05-20 — End: 2023-05-20
  Administered 2023-05-20: 2 mg via INTRAVENOUS
  Filled 2023-05-20: qty 2

## 2023-05-20 MED ORDER — OXYCODONE HCL 5 MG PO TABS: 5.0000 mg | ORAL_TABLET | Freq: Once | ORAL | Status: DC | PRN

## 2023-05-20 MED ORDER — ONDANSETRON HCL 4 MG/2ML IJ SOLN: 4.0000 mg | Freq: Four times a day (QID) | INTRAMUSCULAR | Status: DC | PRN

## 2023-05-20 MED ORDER — 0.9 % SODIUM CHLORIDE (POUR BTL) OPTIME
TOPICAL | Status: DC | PRN
  Administered 2023-05-20: 1000 mL

## 2023-05-20 MED ORDER — METHOCARBAMOL 500 MG PO TABS
500.0000 mg | ORAL_TABLET | Freq: Four times a day (QID) | ORAL | Status: DC
  Administered 2023-05-20 – 2023-05-22 (×8): 500 mg via ORAL
  Filled 2023-05-20 (×8): qty 1

## 2023-05-20 MED ORDER — OXYCODONE HCL 5 MG PO TABS
2.5000 mg | ORAL_TABLET | ORAL | Status: DC | PRN
  Administered 2023-05-20: 5 mg via ORAL
  Filled 2023-05-20: qty 1

## 2023-05-20 MED ORDER — PROPOFOL 10 MG/ML IV BOLUS
INTRAVENOUS | Status: AC
  Filled 2023-05-20: qty 20

## 2023-05-20 MED ORDER — LACTATED RINGERS IV SOLN: INTRAVENOUS | Status: DC

## 2023-05-20 MED ORDER — OXYCODONE HCL 5 MG PO TABS
5.0000 mg | ORAL_TABLET | ORAL | Status: DC | PRN
  Administered 2023-05-21: 5 mg via ORAL
  Administered 2023-05-21 – 2023-05-22 (×2): 10 mg via ORAL
  Filled 2023-05-20 (×2): qty 2
  Filled 2023-05-20: qty 1

## 2023-05-20 MED ORDER — CEFAZOLIN SODIUM-DEXTROSE 2-4 GM/100ML-% IV SOLN
2.0000 g | Freq: Four times a day (QID) | INTRAVENOUS | Status: AC
  Administered 2023-05-20 – 2023-05-21 (×2): 2 g via INTRAVENOUS
  Filled 2023-05-20 (×2): qty 100

## 2023-05-20 MED ORDER — CHLORHEXIDINE GLUCONATE 0.12 % MT SOLN
OROMUCOSAL | Status: AC
  Administered 2023-05-20: 15 mL via OROMUCOSAL
  Filled 2023-05-20: qty 15

## 2023-05-20 MED ORDER — OXYCODONE HCL 5 MG/5ML PO SOLN: 5.0000 mg | Freq: Once | ORAL | Status: DC | PRN

## 2023-05-20 MED ORDER — TETANUS-DIPHTH-ACELL PERTUSSIS 5-2.5-18.5 LF-MCG/0.5 IM SUSY
0.5000 mL | PREFILLED_SYRINGE | Freq: Once | INTRAMUSCULAR | Status: AC
  Administered 2023-05-20: 0.5 mL via INTRAMUSCULAR
  Filled 2023-05-20: qty 0.5

## 2023-05-20 MED ORDER — FENTANYL CITRATE (PF) 100 MCG/2ML IJ SOLN: 25.0000 ug | INTRAMUSCULAR | Status: DC | PRN

## 2023-05-20 MED ORDER — DEXMEDETOMIDINE HCL IN NACL 80 MCG/20ML IV SOLN
INTRAVENOUS | Status: DC | PRN
Start: 2023-05-20 — End: 2023-05-20
  Administered 2023-05-20: 8 ug via INTRAVENOUS

## 2023-05-20 MED ORDER — SULFAMETHOXAZOLE-TRIMETHOPRIM 800-160 MG PO TABS
1.0000 | ORAL_TABLET | Freq: Two times a day (BID) | ORAL | Status: DC
  Administered 2023-05-20 – 2023-05-22 (×4): 1 via ORAL
  Filled 2023-05-20 (×4): qty 1

## 2023-05-20 MED ORDER — FENTANYL CITRATE (PF) 100 MCG/2ML IJ SOLN
INTRAMUSCULAR | Status: AC
  Administered 2023-05-20: 100 ug via INTRAVENOUS
  Filled 2023-05-20: qty 2

## 2023-05-20 MED ORDER — ACETAMINOPHEN 500 MG PO TABS
1000.0000 mg | ORAL_TABLET | Freq: Three times a day (TID) | ORAL | Status: AC
  Administered 2023-05-20 – 2023-05-21 (×4): 1000 mg via ORAL
  Filled 2023-05-20 (×4): qty 2

## 2023-05-20 MED ORDER — PROPOFOL 10 MG/ML IV BOLUS
INTRAVENOUS | Status: AC | PRN
  Administered 2023-05-20: 20 mg via INTRAVENOUS
  Administered 2023-05-20: 2 mg via INTRAVENOUS
  Administered 2023-05-20: 20 mg via INTRAVENOUS

## 2023-05-20 MED ORDER — LIDOCAINE 2% (20 MG/ML) 5 ML SYRINGE
INTRAMUSCULAR | Status: AC
  Filled 2023-05-20: qty 5

## 2023-05-20 MED ORDER — FENTANYL CITRATE (PF) 100 MCG/2ML IJ SOLN: 100.0000 ug | Freq: Once | INTRAMUSCULAR | Status: AC

## 2023-05-20 MED ORDER — SENNA 8.6 MG PO TABS
1.0000 | ORAL_TABLET | Freq: Two times a day (BID) | ORAL | Status: DC
  Administered 2023-05-20 – 2023-05-22 (×4): 8.6 mg via ORAL
  Filled 2023-05-20 (×4): qty 1

## 2023-05-20 MED ORDER — VANCOMYCIN HCL 1000 MG IV SOLR
INTRAVENOUS | Status: AC
  Filled 2023-05-20: qty 20

## 2023-05-20 MED ORDER — ORAL CARE MOUTH RINSE: 15.0000 mL | Freq: Once | OROMUCOSAL | Status: AC

## 2023-05-20 MED ORDER — MIDAZOLAM HCL 2 MG/2ML IJ SOLN: 2.0000 mg | Freq: Once | INTRAMUSCULAR | Status: AC

## 2023-05-20 MED ORDER — POLYETHYLENE GLYCOL 3350 17 G PO PACK
17.0000 g | PACK | Freq: Every day | ORAL | Status: DC
  Administered 2023-05-22: 17 g via ORAL
  Filled 2023-05-20 (×2): qty 1

## 2023-05-20 MED ORDER — HYDROMORPHONE HCL 1 MG/ML IJ SOLN
0.5000 mg | Freq: Once | INTRAMUSCULAR | Status: AC
  Administered 2023-05-20: 0.5 mg via INTRAVENOUS
  Filled 2023-05-20: qty 0.5

## 2023-05-20 MED ORDER — AMISULPRIDE (ANTIEMETIC) 5 MG/2ML IV SOLN: 10.0000 mg | Freq: Once | INTRAVENOUS | Status: DC | PRN

## 2023-05-20 MED ORDER — PROPOFOL 500 MG/50ML IV EMUL
INTRAVENOUS | Status: DC | PRN
  Administered 2023-05-20: 50 mg via INTRAVENOUS
  Administered 2023-05-20: 125 ug/kg/min via INTRAVENOUS
  Administered 2023-05-20: 100 ug/kg/min via INTRAVENOUS

## 2023-05-20 MED ORDER — FENTANYL CITRATE PF 50 MCG/ML IJ SOSY: 100.0000 ug | PREFILLED_SYRINGE | INTRAMUSCULAR | Status: DC | PRN

## 2023-05-20 MED ORDER — ONDANSETRON HCL 4 MG/2ML IJ SOLN
INTRAMUSCULAR | Status: DC | PRN
  Administered 2023-05-20: 4 mg via INTRAVENOUS

## 2023-05-20 MED ORDER — ONDANSETRON HCL 4 MG/2ML IJ SOLN
INTRAMUSCULAR | Status: AC
  Filled 2023-05-20: qty 2

## 2023-05-20 MED ORDER — VANCOMYCIN HCL 1000 MG IV SOLR
INTRAVENOUS | Status: DC | PRN
  Administered 2023-05-20: 1000 mg via TOPICAL

## 2023-05-20 MED ORDER — PROPOFOL 10 MG/ML IV BOLUS
0.5000 mg/kg | Freq: Once | INTRAVENOUS | Status: AC
  Administered 2023-05-20: 30 mg via INTRAVENOUS
  Filled 2023-05-20: qty 20

## 2023-05-20 MED ORDER — BUPIVACAINE-EPINEPHRINE (PF) 0.5% -1:200000 IJ SOLN
INTRAMUSCULAR | Status: DC | PRN
  Administered 2023-05-20: 30 mL via PERINEURAL

## 2023-05-20 MED ORDER — CHLORHEXIDINE GLUCONATE 0.12 % MT SOLN: 15.0000 mL | Freq: Once | OROMUCOSAL | Status: AC

## 2023-05-20 MED ORDER — CEFAZOLIN SODIUM-DEXTROSE 2-4 GM/100ML-% IV SOLN
2.0000 g | INTRAVENOUS | Status: AC
  Administered 2023-05-20: 2 g via INTRAVENOUS
  Filled 2023-05-20: qty 100

## 2023-05-20 MED ORDER — DEXAMETHASONE SODIUM PHOSPHATE 10 MG/ML IJ SOLN
INTRAMUSCULAR | Status: AC
  Filled 2023-05-20: qty 1

## 2023-05-20 MED ORDER — TRANEXAMIC ACID-NACL 1000-0.7 MG/100ML-% IV SOLN
1000.0000 mg | Freq: Once | INTRAVENOUS | Status: AC
  Administered 2023-05-21: 1000 mg via INTRAVENOUS
  Filled 2023-05-20: qty 100

## 2023-05-20 MED ORDER — PROPOFOL 10 MG/ML IV BOLUS: INTRAVENOUS | Status: DC | PRN

## 2023-05-20 SURGICAL SUPPLY — 55 items
ALCOHOL 70% 16 OZ (MISCELLANEOUS) ×1 IMPLANT
BAG COUNTER SPONGE SURGICOUNT (BAG) ×1 IMPLANT
BIT DRILL QC 2.5MM SHRT EVO SM (DRILL) IMPLANT
BNDG COHESIVE 4X5 TAN STRL LF (GAUZE/BANDAGES/DRESSINGS) IMPLANT
BNDG ELASTIC 4INX 5YD STR LF (GAUZE/BANDAGES/DRESSINGS) IMPLANT
CORD BIPOLAR FORCEPS 12FT (ELECTRODE) IMPLANT
COVER SURGICAL LIGHT HANDLE (MISCELLANEOUS) ×1 IMPLANT
DERMABOND ADVANCED .7 DNX12 (GAUZE/BANDAGES/DRESSINGS) IMPLANT
DRAPE C-ARM 42X72 X-RAY (DRAPES) IMPLANT
DRAPE IMP U-DRAPE 54X76 (DRAPES) ×1 IMPLANT
DRAPE OEC MINIVIEW 54X84 (DRAPES) ×1 IMPLANT
DRAPE U-SHAPE 47X51 STRL (DRAPES) ×1 IMPLANT
DRILL QC 2.5MM SHORT EVOS SM (DRILL) ×1 IMPLANT
DRSG MEPILEX POST OP 4X8 (GAUZE/BANDAGES/DRESSINGS) IMPLANT
DRSG XEROFORM 1X8 (GAUZE/BANDAGES/DRESSINGS) IMPLANT
DURAPREP 26ML APPLICATOR (WOUND CARE) ×1 IMPLANT
ELECT REM PT RETURN 9FT ADLT (ELECTROSURGICAL) ×1 IMPLANT
ELECTRODE REM PT RTRN 9FT ADLT (ELECTROSURGICAL) ×1 IMPLANT
GAUZE PAD ABD 8X10 STRL (GAUZE/BANDAGES/DRESSINGS) IMPLANT
GAUZE SPONGE 4X4 12PLY STRL (GAUZE/BANDAGES/DRESSINGS) IMPLANT
GLOVE BIO SURGEON STRL SZ7.5 (GLOVE) ×1 IMPLANT
GLOVE INDICATOR 7.5 STRL GRN (GLOVE) ×1 IMPLANT
GOWN STRL REUS W/ TWL XL LVL3 (GOWN DISPOSABLE) ×1 IMPLANT
KIT BASIN OR (CUSTOM PROCEDURE TRAY) ×1 IMPLANT
KIT TURNOVER KIT B (KITS) ×1 IMPLANT
MANIFOLD NEPTUNE II (INSTRUMENTS) ×1 IMPLANT
NDL 22X1.5 STRL (OR ONLY) (MISCELLANEOUS) IMPLANT
NEEDLE 22X1.5 STRL (OR ONLY) (MISCELLANEOUS) IMPLANT
NS IRRIG 1000ML POUR BTL (IV SOLUTION) ×1 IMPLANT
PACK ORTHO EXTREMITY (CUSTOM PROCEDURE TRAY) ×1 IMPLANT
PAD ARMBOARD POSITIONER FOAM (MISCELLANEOUS) ×1 IMPLANT
PLATE LOCK EVOS 3.5X93 (Plate) IMPLANT
PLATE RAD SHAFT EVOS 98 8H (Plate) IMPLANT
RESTRAINT HEAD UNIVERSAL NS (MISCELLANEOUS) ×1 IMPLANT
SCREW CORT 3.5X10MM ST EVOS (Screw) IMPLANT
SCREW CORT 3.5X11 ST EVOS (Screw) IMPLANT
SCREW CORT 3.5X13MM (Screw) IMPLANT
SCREW CORT EVOS ST 3.5X12 (Screw) IMPLANT
SPONGE T-LAP 18X18 ~~LOC~~+RFID (SPONGE) ×2 IMPLANT
SPONGE T-LAP 4X18 ~~LOC~~+RFID (SPONGE) IMPLANT
STAPLER VISISTAT 35W (STAPLE) ×1 IMPLANT
STOCKINETTE IMPERVIOUS 9X36 MD (GAUZE/BANDAGES/DRESSINGS) IMPLANT
SUCTION TUBE FRAZIER 10FR DISP (SUCTIONS) IMPLANT
SUT ETHILON 3 0 PS 1 (SUTURE) IMPLANT
SUT MNCRL AB 3-0 PS2 18 (SUTURE) IMPLANT
SUT VIC AB 0 CT1 18XCR BRD 8 (SUTURE) ×1 IMPLANT
SUT VIC AB 0 CT1 18XCR BRD8 (SUTURE) IMPLANT
SUT VIC AB 0 CT1 27XCR 8 STRN (SUTURE) IMPLANT
SUT VIC AB 2-0 CT1 18 (SUTURE) ×1 IMPLANT
SUT VIC AB 2-0 CT2 18 VCP726D (SUTURE) IMPLANT
SUT VIC AB 2-0 CT2 27 (SUTURE) IMPLANT
TOWEL GREEN STERILE (TOWEL DISPOSABLE) ×1 IMPLANT
TOWEL GREEN STERILE FF (TOWEL DISPOSABLE) ×1 IMPLANT
WATER STERILE IRR 1000ML POUR (IV SOLUTION) ×1 IMPLANT
YANKAUER SUCT BULB TIP NO VENT (SUCTIONS) ×1 IMPLANT

## 2023-05-20 NOTE — ED Provider Notes (Signed)
 I assumed care of patient after she was transferred from [facility] ER to see Dr. Christell Constant with hand surgery at this hospital. Patient has significant fracture to the right forearm. On my exam she is awake/alert in no acute distress.  Aside from the pain in her right arm, she has no other pain complaints. She has no chest or abdominal tenderness.  GCS is 15 No signs of lower extremity injury Standing pain medications have been ordered  Discussed the case with Dr. Christell Constant with ortho Hand He is now seeing the patient.  Plan to admit for operative management later this evening   Zadie Rhine, MD 05/20/23 [phone]
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
## 2023-05-20 NOTE — Anesthesia Procedure Notes (Signed)
 Anesthesia Regional Block: Supraclavicular block   Pre-Anesthetic Checklist: , timeout performed,  Correct Patient, Correct Site, Correct Laterality,  Correct Procedure, Correct Position, site marked,  Risks and benefits discussed,  Pre-op evaluation,  At surgeon's request and post-op pain management  Laterality: Right  Prep: Maximum Sterile Barrier Precautions used, chloraprep       Needles:  Injection technique: Single-shot  Needle Type: Echogenic Stimulator Needle     Needle Length: 5cm  Needle Gauge: 22     Additional Needles:   Procedures:,,,, ultrasound used (permanent image in chart),,    Narrative:  Start time: 05/20/2023 5:21 PM End time: 05/20/2023 5:31 PM Injection made incrementally with aspirations every 5 mL.  Performed by: Personally  Anesthesiologist: Gaynelle Adu, MD

## 2023-05-20 NOTE — ED Notes (Signed)
 Pt ambulated to restroom without incident  Pt states she was informed she could eat until 9:00 today. RN is trying to verify.

## 2023-05-20 NOTE — ED Notes (Signed)
 RN attempted to contact Willia Craze MD but account set as DND.   RN contacted
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
## 2023-05-20 NOTE — Transfer of Care (Signed)
 Immediate Anesthesia Transfer of Care Note  Patient: Laura Parker  Procedure(s) Performed: OPEN REDUCTION INTERNAL FIXATION (ORIF) RADIAL FRACTURE (Right: Arm Lower)  Patient Location: PACU  Anesthesia Type:Regional  Level of Consciousness: awake, alert , and oriented  Airway & Oxygen Therapy: Patient Spontanous Breathing and Patient connected to nasal cannula oxygen  Post-op Assessment: Report given to RN and Post -op Vital signs reviewed and stable  Post vital signs: Reviewed and stable  Last Vitals:  Vitals Value Taken Time  BP 120/74 05/20/23 2202  Temp    Pulse 69 05/20/23 2206  Resp 22 05/20/23 2206  SpO2 97 % 05/20/23 2206  Vitals shown include unfiled device data.  Last Pain:  Vitals:   05/20/23 1735  TempSrc:   PainSc: 0-No pain      Patients Stated Pain Goal: 2 (05/20/23 1728)  Complications: No notable events documented.

## 2023-05-20 NOTE — ED Notes (Signed)
 RN attempted to reach Laura Craze MD but account set as DND.  RN spoke with Charma Igo which recommend at this time no food. He will f/u with pt on 2W10 when he has a chance. The pt isn't on his roster at this time d/t being transferred from [facility].  Pt procedure scheduled for 16:40 today

## 2023-05-20 NOTE — Brief Op Note (Signed)
 05/20/2023  10:03 PM  PATIENT:  Antoine Primas  23 y.o. female  PRE-OPERATIVE DIAGNOSIS:  RIGHT BOTH BONE FOREARM FRACTURE  POST-OPERATIVE DIAGNOSIS:  RIGHT BOTH BONE FOREARM FRACTURE  PROCEDURE:  ORIF RADIUS AND ULNA FRACTURE, IRRIGATION AND CLOSURE OF HAND LACERATION   SURGEON:  Surgeons and Role:    * London Sheer, MD - Primary  PHYSICIAN ASSISTANT: none  ASSISTANTS: none   ANESTHESIA:   regional and MAC  EBL:  100 mL   BLOOD ADMINISTERED:none  DRAINS: none   LOCAL MEDICATIONS USED:  NONE  SPECIMEN:  No Specimen  DISPOSITION OF SPECIMEN:  N/A  COUNTS:  YES  TOURNIQUET: NONE  DICTATION: .Note written in EPIC  PLAN OF CARE: Admit for overnight observation  PATIENT DISPOSITION:  PACU - hemodynamically stable.   Delay start of Pharmacological VTE agent (>24hrs) due to surgical blood loss or risk of bleeding: no

## 2023-05-20 NOTE — Discharge Instructions (Addendum)
 Orthopedic Surgery Discharge Instructions  Patient name: Laura Parker Fracture: right both bone forearm fracture Procedure Performed: right both bone forearm fracture open reduction internal fixation Date of Surgery: 05/20/2023 Surgeon: Willia Craze, MD  Activity: You should not lift or carry anything greater than 5 pounds on the right upper extremity. You can move your elbow and wrist as much as you would like. For the first week after surgery, it is important to elevate the arm higher than the level of the heart and apply ice over the forearm. Swelling can cause pain so icing and elevating especially during the first week will be helpful in controlling your pain.   Incision Care: Your incisions have dressings over them. Those dressings should remain in place and dry at all times for a total of one week after surgery. After one week, you can remove the dressing. Underneath the dressing, you will find skin glue. Your thumb has two stiches there to close the cut you had there. You should leave the skin glue and stitches in place. The skin glue will fall off with time. Do not pick, rub, or scrub at the skin glue or stitches. Do not put cream or lotion over the surgical area. After one week and once the dressing is off, it is okay to let soap and water run over your incisions. Again, do not pick, scrub, or rub at the skin glue or stitches when bathing. Pat dry. Do not scrub dry. Do not submerge (e.g., take a bath, swim, go in a hot tub, etc.) until six weeks after surgery. There may be some bloody drainage from the incision into the dressing after surgery. This is normal. You do not need to replace the dressing. Continue to leave it in place for the one week as instructed above. Should the dressing become saturated with blood or drainage, please call the office for further instructions.   Medications: You have been prescribed oxycodone. This is a narcotic pain medication and should only be taken as  prescribed. You should not drink alcohol or operate heavy machinery (including driving) while taking this medication. The oxycodone can cause constipation as a side effect. For that reason, you have been prescribed senna and miralax. These are both laxatives. You do not need to take this medication if you develop diarrhea. Should you remain constipated even while taking the senna and miralax, please use the miralax twice daily. Tylenol has been prescribed to be taken every 8 hours, which will give you additional pain relief. Robaxin is a muscle relaxer that can help with pain particularly with muscle spasms. You should take this medication four times per day for the first couple of days after surgery. Celebrex is an anti-inflammatory medication that you should take twice per day for the first week. After one week, you can stop using this medication.   You may resume any home blood thinners (warfarin, lovenox, apixaban, plavix, xarelto, etc) after your surgery. Take these medications as they were previously prescribed.  You should not use over-the-counter NSAIDs (ibuprofen, Aleve, naproxen, meloxicam, etc.) for pain relief because there is some evidence that these medications decrease your body's ability to heal fractures.  In order to set expectations for opioid prescriptions, you will only be prescribed opioids for a total of six weeks after surgery and, at two-weeks after surgery, your opioid prescription will start to tapered (decreased dosage and number of pills). If you have ongoing need for opioid medication six weeks after surgery, you will be referred  to pain management. If you are already established with a provider that is giving you opioid medications, you should schedule an appointment with them for six weeks after surgery if you feel you are going to need another prescription. State law only allows for opioid prescriptions one week at a time. If you are running out of opioid medication near the  end of the week, please call the office during business hours before running out so I can send you another prescription.   Driving: You should not drive while taking narcotic pain medications. You should start getting back to driving slowly and you may want to try driving in a parking lot before doing anything more.   Diet: You are safe to resume your regular diet after surgery.   Reasons to Call the Office After Surgery: You should feel free to call the office with any concerns or questions you have in the post-operative period, but you should definitely notify the office if you develop: -shortness of breath, chest pain, or trouble breathing -excessive bleeding, drainage, redness, or swelling around the surgical site -fevers, chills, or pain that is getting worse with each passing day -persistent nausea or vomiting -new weakness in the right arm, new or worsening numbness or tingling in the right arm -other concerns about your surgery  Follow Up Appointments: You have a follow up visit with Dr. Christell Constant on 06/05/2023 at 4pm. Please arrive on time to this appointment. The office location and phone number are listed below.   Office Information:  -Willia Craze, MD -Phone number: [phone] -Address: [address]       [place], [place] [address]

## 2023-05-20 NOTE — Progress Notes (Signed)
 Orthopedic Tech Progress Note Patient Details:  Laura Parker 2000/12/15 [id number]  Ortho Devices Type of Ortho Device: Sugartong splint Ortho Device/Splint Location: rue Ortho Device/Splint Interventions: Ordered, Application, Adjustment  I applied splint post reduction with drs assist. Post Interventions Patient Tolerated: Well Instructions Provided: Care of device, Adjustment of device  [name] Post 05/20/2023, 3:39 AM
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
## 2023-05-21 ENCOUNTER — Other Ambulatory Visit (HOSPITAL_COMMUNITY): Payer: Self-pay

## 2023-05-21 DIAGNOSIS — S8001XA Contusion of right knee, initial encounter: Secondary | ICD-10-CM | POA: Diagnosis present

## 2023-05-21 DIAGNOSIS — Z23 Encounter for immunization: Secondary | ICD-10-CM | POA: Diagnosis not present

## 2023-05-21 DIAGNOSIS — S52351A Displaced comminuted fracture of shaft of radius, right arm, initial encounter for closed fracture: Secondary | ICD-10-CM | POA: Diagnosis present

## 2023-05-21 DIAGNOSIS — S52251A Displaced comminuted fracture of shaft of ulna, right arm, initial encounter for closed fracture: Secondary | ICD-10-CM | POA: Diagnosis present

## 2023-05-21 DIAGNOSIS — S61411A Laceration without foreign body of right hand, initial encounter: Secondary | ICD-10-CM | POA: Diagnosis present

## 2023-05-21 DIAGNOSIS — S0081XA Abrasion of other part of head, initial encounter: Secondary | ICD-10-CM | POA: Diagnosis present

## 2023-05-21 DIAGNOSIS — F17210 Nicotine dependence, cigarettes, uncomplicated: Secondary | ICD-10-CM | POA: Diagnosis present

## 2023-05-21 DIAGNOSIS — Z888 Allergy status to other drugs, medicaments and biological substances status: Secondary | ICD-10-CM | POA: Diagnosis not present

## 2023-05-21 MED ORDER — METHOCARBAMOL 500 MG PO TABS
500.0000 mg | ORAL_TABLET | Freq: Four times a day (QID) | ORAL | 0 refills | Status: AC
  Filled 2023-05-21: qty 40, 10d supply, fill #0

## 2023-05-21 MED ORDER — HYDROMORPHONE HCL 1 MG/ML IJ SOLN
0.5000 mg | Freq: Once | INTRAMUSCULAR | Status: AC
  Administered 2023-05-21: 0.5 mg via INTRAVENOUS
  Filled 2023-05-21: qty 0.5

## 2023-05-21 MED ORDER — SULFAMETHOXAZOLE-TRIMETHOPRIM 800-160 MG PO TABS
1.0000 | ORAL_TABLET | Freq: Two times a day (BID) | ORAL | 0 refills | Status: AC
  Filled 2023-05-21: qty 20, 10d supply, fill #0

## 2023-05-21 MED ORDER — ACETAMINOPHEN 500 MG PO TABS
1000.0000 mg | ORAL_TABLET | Freq: Three times a day (TID) | ORAL | Status: DC
  Administered 2023-05-21 – 2023-05-22 (×3): 1000 mg via ORAL
  Filled 2023-05-21 (×5): qty 2

## 2023-05-21 MED ORDER — POLYETHYLENE GLYCOL 3350 17 GM/SCOOP PO POWD
17.0000 g | Freq: Every day | ORAL | 0 refills | Status: AC
  Filled 2023-05-21: qty 238, 14d supply, fill #0

## 2023-05-21 MED ORDER — SENNA 8.6 MG PO TABS
1.0000 | ORAL_TABLET | Freq: Two times a day (BID) | ORAL | 0 refills | Status: AC
  Filled 2023-05-21: qty 28, 14d supply, fill #0

## 2023-05-21 MED ORDER — GABAPENTIN 100 MG PO CAPS
200.0000 mg | ORAL_CAPSULE | Freq: Three times a day (TID) | ORAL | Status: DC
  Administered 2023-05-21 – 2023-05-22 (×3): 200 mg via ORAL
  Filled 2023-05-21 (×4): qty 2

## 2023-05-21 MED ORDER — OXYCODONE HCL 5 MG PO TABS
5.0000 mg | ORAL_TABLET | ORAL | 0 refills | Status: DC | PRN
  Filled 2023-05-21: qty 40, 4d supply, fill #0

## 2023-05-21 MED ORDER — ACETAMINOPHEN 500 MG PO TABS
1000.0000 mg | ORAL_TABLET | Freq: Three times a day (TID) | ORAL | 0 refills | Status: AC
  Filled 2023-05-21: qty 84, 14d supply, fill #0

## 2023-05-21 MED ORDER — KETOROLAC TROMETHAMINE 30 MG/ML IJ SOLN
30.0000 mg | Freq: Four times a day (QID) | INTRAMUSCULAR | Status: DC
  Administered 2023-05-21 – 2023-05-22 (×4): 30 mg via INTRAVENOUS
  Filled 2023-05-21 (×4): qty 1

## 2023-05-21 NOTE — Plan of Care (Signed)
 Vitals stable. Dsg and sling intact to right arm. Pt ambulated with assistance to bathroom. Voided. No prns given. Scheduled Tylenol and robaxin given once arrived to unit.  Problem: Education: Goal: Knowledge of General Education information will improve Description: Including pain rating scale, medication(s)/side effects and non-pharmacologic comfort measures Outcome: Progressing   Problem: Health Behavior/Discharge Planning: Goal: Ability to manage health-related needs will improve Outcome: Progressing   Problem: Clinical Measurements: Goal: Ability to maintain clinical measurements within normal limits will improve Outcome: Progressing Goal: Will remain free from infection Outcome: Progressing Goal: Diagnostic test results will improve Outcome: Progressing Goal: Respiratory complications will improve Outcome: Progressing Goal: Cardiovascular complication will be avoided Outcome: Progressing   Problem: Activity: Goal: Risk for activity intolerance will decrease Outcome: Progressing   Problem: Nutrition: Goal: Adequate nutrition will be maintained Outcome: Progressing   Problem: Coping: Goal: Level of anxiety will decrease Outcome: Progressing   Problem: Elimination: Goal: Will not experience complications related to bowel motility Outcome: Progressing Goal: Will not experience complications related to urinary retention Outcome: Progressing   Problem: Pain Managment: Goal: General experience of comfort will improve and/or be controlled Outcome: Progressing   Problem: Safety: Goal: Ability to remain free from injury will improve Outcome: Progressing   Problem: Skin Integrity: Goal: Risk for impaired skin integrity will decrease Outcome: Progressing
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
## 2023-05-21 NOTE — Anesthesia Postprocedure Evaluation (Signed)
 Anesthesia Post Note  Patient: Laura Parker  Procedure(s) Performed: ORIF RIGHT RADIUS AND ULNA FRACTURE, IRRIGATION AND CLOSURE OF HAND LACERATION (Right: Arm Lower)     Patient location during evaluation: PACU Anesthesia Type: MAC and Regional Level of consciousness: awake and alert Pain management: pain level controlled Vital Signs Assessment: post-procedure vital signs reviewed and stable Respiratory status: spontaneous breathing, nonlabored ventilation, respiratory function stable and patient connected to nasal cannula oxygen Cardiovascular status: stable and blood pressure returned to baseline Postop Assessment: no apparent nausea or vomiting Anesthetic complications: no   No notable events documented.  Last Vitals:  Vitals:   05/20/23 2302 05/21/23 0436  BP: 113/63 123/78  Pulse: 65 68  Resp: 20 19  Temp: 37.1 C 37.3 C  SpO2: 100% 100%    Last Pain:  Vitals:   05/21/23 0618  TempSrc:   PainSc: 1                  [place] Nation
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
## 2023-05-21 NOTE — Progress Notes (Signed)
 Pt c/o pain 10/10 and uncontrollably crying. Notified Moore, MD that pt is in intense pain. New orders provided for pt pain control. After new orders administered pt is calm and resting.

## 2023-05-22 ENCOUNTER — Encounter (HOSPITAL_COMMUNITY): Payer: Self-pay | Admitting: Orthopedic Surgery

## 2023-05-22 ENCOUNTER — Other Ambulatory Visit (HOSPITAL_COMMUNITY): Payer: Self-pay

## 2023-05-22 MED ORDER — CELECOXIB 200 MG PO CAPS
200.0000 mg | ORAL_CAPSULE | Freq: Two times a day (BID) | ORAL | 0 refills | Status: AC
  Filled 2023-05-22: qty 14, 7d supply, fill #0
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
## 2023-05-25 ENCOUNTER — Emergency Department (HOSPITAL_COMMUNITY)

## 2023-05-25 ENCOUNTER — Telehealth

## 2023-05-25 ENCOUNTER — Other Ambulatory Visit: Payer: Self-pay

## 2023-05-25 ENCOUNTER — Encounter (HOSPITAL_COMMUNITY): Payer: Self-pay

## 2023-05-25 ENCOUNTER — Emergency Department (HOSPITAL_COMMUNITY): Admission: EM | Admit: 2023-05-25 | Discharge: 2023-05-25 | Disposition: A | Discharge status: Home / Self Care

## 2023-05-25 DIAGNOSIS — M25561 Pain in right knee: Secondary | ICD-10-CM | POA: Insufficient documentation

## 2023-05-25 DIAGNOSIS — Y92410 Unspecified street and highway as the place of occurrence of the external cause: Secondary | ICD-10-CM | POA: Insufficient documentation

## 2023-05-25 DIAGNOSIS — R1084 Generalized abdominal pain: Secondary | ICD-10-CM | POA: Insufficient documentation

## 2023-05-25 DIAGNOSIS — S8002XA Contusion of left knee, initial encounter: Secondary | ICD-10-CM | POA: Insufficient documentation

## 2023-05-25 DIAGNOSIS — M25531 Pain in right wrist: Secondary | ICD-10-CM | POA: Insufficient documentation

## 2023-05-25 DIAGNOSIS — M79631 Pain in right forearm: Secondary | ICD-10-CM | POA: Diagnosis not present

## 2023-05-25 LAB — COMPREHENSIVE METABOLIC PANEL WITH GFR
ALT: 25 U/L (ref 0–44)
AST: 34 U/L (ref 15–41)
Albumin: 3.3 g/dL — ABNORMAL LOW (ref 3.5–5.0)
Alkaline Phosphatase: 32 U/L — ABNORMAL LOW (ref 38–126)
Anion gap: 9 (ref 5–15)
BUN: 13 mg/dL (ref 6–20)
CO2: 18 mmol/L — ABNORMAL LOW (ref 22–32)
Calcium: 8.6 mg/dL — ABNORMAL LOW (ref 8.9–10.3)
Chloride: 103 mmol/L (ref 98–111)
Creatinine, Ser: 0.87 mg/dL (ref 0.44–1.00)
GFR, Estimated: >60 mL/min (ref >60)
Glucose, Bld: 94 mg/dL (ref 70–99)
Potassium: 4.3 mmol/L (ref 3.5–5.1)
Sodium: 130 mmol/L — ABNORMAL LOW (ref 135–145)
Total Bilirubin: 0.3 mg/dL (ref 0.0–1.2)
Total Protein: 6.6 g/dL (ref 6.5–8.1)

## 2023-05-25 LAB — URINALYSIS, ROUTINE W REFLEX MICROSCOPIC
Bilirubin Urine: NEGATIVE
Glucose, UA: NEGATIVE mg/dL
Hgb urine dipstick: NEGATIVE
Ketones, ur: NEGATIVE mg/dL
Leukocytes,Ua: NEGATIVE
Nitrite: NEGATIVE
Protein, ur: NEGATIVE mg/dL
Specific Gravity, Urine: 1.024 (ref 1.005–1.030)
pH: 7 (ref 5.0–8.0)

## 2023-05-25 LAB — CBC
HCT: 34.1 % — ABNORMAL LOW (ref 36.0–46.0)
Hemoglobin: 11.4 g/dL — ABNORMAL LOW (ref 12.0–15.0)
MCH: 31.5 pg (ref 26.0–34.0)
MCHC: 33.4 g/dL (ref 30.0–36.0)
MCV: 94.2 fL (ref 80.0–100.0)
Platelets: 207 10*3/uL (ref 150–400)
RBC: 3.62 MIL/uL — ABNORMAL LOW (ref 3.87–5.11)
RDW: 13.2 % (ref 11.5–15.5)
WBC: 3.7 10*3/uL — ABNORMAL LOW (ref 4.0–10.5)
nRBC: 0 % (ref 0.0–0.2)

## 2023-05-25 LAB — HCG, SERUM, QUALITATIVE: Preg, Serum: NEGATIVE

## 2023-05-25 LAB — LIPASE, BLOOD: Lipase: 35 U/L (ref 11–51)

## 2023-05-25 MED ORDER — MORPHINE SULFATE (PF) 4 MG/ML IV SOLN
4.0000 mg | Freq: Once | INTRAVENOUS | Status: AC
  Administered 2023-05-25: 4 mg via INTRAVENOUS
  Filled 2023-05-25: qty 1

## 2023-05-25 MED ORDER — IOHEXOL 350 MG/ML SOLN
75.0000 mL | Freq: Once | INTRAVENOUS | Status: AC | PRN
  Administered 2023-05-25: 75 mL via INTRAVENOUS

## 2023-05-25 NOTE — Discharge Instructions (Addendum)
 You were evaluated in the emergency room for abdominal pain, right wrist pain and right knee pain.  Your lab work and CT scan did not show any significant abnormality.  Please follow-up with your surgeon this Monday.  Please try to gradually decrease the amount of oxycodone you are taken.  You may start taking the laxative powder twice a day in addition to the senna twice a day.

## 2023-05-25 NOTE — ED Notes (Signed)
 ED Provider at bedside.

## 2023-05-25 NOTE — ED Notes (Signed)
 Pt asked to provide urine sample. Refused at this time.
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
## 2023-05-27 ENCOUNTER — Encounter: Payer: Self-pay | Admitting: Physical Therapy

## 2023-05-27 ENCOUNTER — Ambulatory Visit: Attending: Orthopedic Surgery | Admitting: Physical Therapy

## 2023-05-27 ENCOUNTER — Other Ambulatory Visit: Payer: Self-pay

## 2023-05-27 DIAGNOSIS — M6281 Muscle weakness (generalized): Secondary | ICD-10-CM | POA: Insufficient documentation

## 2023-05-27 DIAGNOSIS — M25631 Stiffness of right wrist, not elsewhere classified: Secondary | ICD-10-CM | POA: Insufficient documentation

## 2023-05-27 DIAGNOSIS — M25521 Pain in right elbow: Secondary | ICD-10-CM | POA: Diagnosis present

## 2023-05-27 DIAGNOSIS — S52391A Other fracture of shaft of radius, right arm, initial encounter for closed fracture: Secondary | ICD-10-CM | POA: Insufficient documentation
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
## 2023-05-28 ENCOUNTER — Other Ambulatory Visit (HOSPITAL_BASED_OUTPATIENT_CLINIC_OR_DEPARTMENT_OTHER): Payer: Self-pay

## 2023-05-28 ENCOUNTER — Other Ambulatory Visit (HOSPITAL_COMMUNITY): Payer: Self-pay

## 2023-05-28 ENCOUNTER — Other Ambulatory Visit: Payer: Self-pay | Admitting: Orthopedic Surgery

## 2023-05-28 MED ORDER — OXYCODONE HCL 5 MG PO TABS
5.0000 mg | ORAL_TABLET | ORAL | 0 refills | Status: AC | PRN
  Filled 2023-05-28 (×2): qty 30, 4d supply, fill #0

## 2023-05-29 ENCOUNTER — Other Ambulatory Visit (HOSPITAL_BASED_OUTPATIENT_CLINIC_OR_DEPARTMENT_OTHER): Payer: Self-pay

## 2023-05-29 ENCOUNTER — Ambulatory Visit
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
## 2023-06-03 ENCOUNTER — Ambulatory Visit
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
## 2023-06-05 ENCOUNTER — Other Ambulatory Visit (INDEPENDENT_AMBULATORY_CARE_PROVIDER_SITE_OTHER): Payer: Self-pay

## 2023-06-05 ENCOUNTER — Ambulatory Visit (INDEPENDENT_AMBULATORY_CARE_PROVIDER_SITE_OTHER): Admitting: Orthopedic Surgery

## 2023-06-05 DIAGNOSIS — S59911D Unspecified injury of right forearm, subsequent encounter: Secondary | ICD-10-CM

## 2023-06-05 MED ORDER — HYDROCODONE-ACETAMINOPHEN 5-325 MG PO TABS: 1.0000 | ORAL_TABLET | ORAL | 0 refills | Status: AC | PRN
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
## 2023-06-06 ENCOUNTER — Telehealth: Payer: Self-pay | Admitting: Orthopedic Surgery

## 2023-06-06 ENCOUNTER — Ambulatory Visit

## 2023-06-06 NOTE — Telephone Encounter (Signed)
 Pt called requesting Kathaleen Pale to ask Dr Sulema Endo to send in pre auth to pharmacy so pt can pick up her medication. Please call pt when authorization has been sent. Pt phone number is [phone].

## 2023-06-06 NOTE — Telephone Encounter (Signed)
 Tried to call but call could not be completed, Auth was done early this morning and has been approved.  Outcome Approved today by [facility]  [place] Case: [id number], Status: Approved, Coverage Starts on: 06/06/2023 12:00:00 AM, Coverage Ends on: 06/13/2023 12:00:00 AM. Effective Date: 06/06/2023 Authorization Expiration Date: 06/13/2023
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
## 2023-06-11 ENCOUNTER — Encounter: Payer: Self-pay | Admitting: Orthopedic Surgery

## 2023-06-11 ENCOUNTER — Ambulatory Visit
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
## 2023-06-13 ENCOUNTER — Ambulatory Visit: Attending: Orthopedic Surgery

## 2023-06-13 DIAGNOSIS — M25521 Pain in right elbow: Secondary | ICD-10-CM | POA: Insufficient documentation

## 2023-06-13 DIAGNOSIS — M25631 Stiffness of right wrist, not elsewhere classified: Secondary | ICD-10-CM | POA: Diagnosis present

## 2023-06-13 DIAGNOSIS — M6281 Muscle weakness (generalized): Secondary | ICD-10-CM | POA: Diagnosis present

## 2023-06-14 MED ORDER — TRAMADOL HCL 50 MG PO TABS: 50.0000 mg | ORAL_TABLET | Freq: Four times a day (QID) | ORAL | 0 refills | Status: DC | PRN

## 2023-06-17 MED ORDER — HYDROCODONE-ACETAMINOPHEN 5-325 MG PO TABS: 1.0000 | ORAL_TABLET | Freq: Four times a day (QID) | ORAL | 0 refills | Status: AC | PRN
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
## 2023-06-17 NOTE — Addendum Note (Signed)
 Addended by: Colette Davies on: 06/17/2023 08:19 AM   Modules accepted: Orders

## 2023-06-18 ENCOUNTER — Ambulatory Visit

## 2023-06-19 ENCOUNTER — Telehealth: Payer: Self-pay | Admitting: Orthopedic Surgery

## 2023-06-19 MED ORDER — CYCLOBENZAPRINE HCL 10 MG PO TABS: 10.0000 mg | ORAL_TABLET | Freq: Three times a day (TID) | ORAL | 0 refills | Status: DC | PRN

## 2023-06-19 NOTE — Telephone Encounter (Signed)
 I called and lmom advising of Dr. Frieda Jew message

## 2023-06-19 NOTE — Addendum Note (Signed)
 Addended by: Colette Davies on: 06/19/2023 12:06 PM   Modules accepted: Orders

## 2023-06-19 NOTE — Telephone Encounter (Signed)
 Patient called and said that her hand is really stiff. She said she can't hardly move her hand. If you can prescribe her something for it please. [phone]

## 2023-06-20 ENCOUNTER — Encounter (HOSPITAL_COMMUNITY): Payer: Self-pay

## 2023-06-20 ENCOUNTER — Ambulatory Visit

## 2023-06-20 ENCOUNTER — Telehealth (HOSPITAL_COMMUNITY): Payer: Self-pay | Admitting: *Deleted

## 2023-06-20 ENCOUNTER — Ambulatory Visit (HOSPITAL_COMMUNITY)
Admission: EM | Admit: 2023-06-20 | Discharge: 2023-06-20 | Disposition: A | Discharge status: Home / Self Care | Attending: Internal Medicine | Admitting: Internal Medicine

## 2023-06-20 ENCOUNTER — Ambulatory Visit (INDEPENDENT_AMBULATORY_CARE_PROVIDER_SITE_OTHER)

## 2023-06-20 DIAGNOSIS — S62600A Fracture of unspecified phalanx of right index finger, initial encounter for closed fracture: Secondary | ICD-10-CM

## 2023-06-20 DIAGNOSIS — S62511A Displaced fracture of proximal phalanx of right thumb, initial encounter for closed fracture: Secondary | ICD-10-CM | POA: Insufficient documentation

## 2023-06-20 DIAGNOSIS — M79643 Pain in unspecified hand: Secondary | ICD-10-CM | POA: Diagnosis not present

## 2023-06-20 DIAGNOSIS — S62619A Displaced fracture of proximal phalanx of unspecified finger, initial encounter for closed fracture: Secondary | ICD-10-CM | POA: Diagnosis present

## 2023-06-20 DIAGNOSIS — X58XXXA Exposure to other specified factors, initial encounter: Secondary | ICD-10-CM | POA: Diagnosis not present

## 2023-06-20 DIAGNOSIS — M79641 Pain in right hand: Secondary | ICD-10-CM

## 2023-06-20 DIAGNOSIS — S52601D Unspecified fracture of lower end of right ulna, subsequent encounter for closed fracture with routine healing: Secondary | ICD-10-CM | POA: Diagnosis not present

## 2023-06-20 DIAGNOSIS — X58XXXD Exposure to other specified factors, subsequent encounter: Secondary | ICD-10-CM | POA: Insufficient documentation

## 2023-06-20 DIAGNOSIS — M7989 Other specified soft tissue disorders: Secondary | ICD-10-CM | POA: Diagnosis not present

## 2023-06-20 DIAGNOSIS — M25641 Stiffness of right hand, not elsewhere classified: Secondary | ICD-10-CM | POA: Diagnosis present

## 2023-06-20 DIAGNOSIS — S52501D Unspecified fracture of the lower end of right radius, subsequent encounter for closed fracture with routine healing: Secondary | ICD-10-CM | POA: Insufficient documentation

## 2023-06-20 LAB — CBC WITH DIFFERENTIAL/PLATELET
Abs Immature Granulocytes: 0.02 10*3/uL (ref 0.00–0.07)
Basophils Absolute: 0 10*3/uL (ref 0.0–0.1)
Basophils Relative: 0 %
Eosinophils Absolute: 0.1 10*3/uL (ref 0.0–0.5)
Eosinophils Relative: 1 %
HCT: 37.1 % (ref 36.0–46.0)
Hemoglobin: 12.5 g/dL (ref 12.0–15.0)
Immature Granulocytes: 0 %
Lymphocytes Relative: 52 %
Lymphs Abs: 2.4 10*3/uL (ref 0.7–4.0)
MCH: 32 pg (ref 26.0–34.0)
MCHC: 33.7 g/dL (ref 30.0–36.0)
MCV: 94.9 fL (ref 80.0–100.0)
Monocytes Absolute: 0.5 10*3/uL (ref 0.1–1.0)
Monocytes Relative: 10 %
Neutro Abs: 1.8 10*3/uL (ref 1.7–7.7)
Neutrophils Relative %: 37 %
Platelets: 195 10*3/uL (ref 150–400)
RBC: 3.91 MIL/uL (ref 3.87–5.11)
RDW: 13.1 % (ref 11.5–15.5)
WBC: 4.8 10*3/uL (ref 4.0–10.5)
nRBC: 0 % (ref 0.0–0.2)

## 2023-06-20 LAB — URIC ACID: Uric Acid, Serum: 5 mg/dL (ref 2.5–7.1)
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
## 2023-06-20 NOTE — Telephone Encounter (Signed)
 Called pt to advise she needs to come back for a splint she has a small fracture per provider. Pt verbalized understand and she will return for a nurse visit today and see ortho tomorrow.

## 2023-06-20 NOTE — Discharge Instructions (Addendum)
 Switch to only taking the Celebrex  for now It is ok to stop the muscle relaxer since it has not helped It is ok to add the Hydrocodone  to the Celebrex  if you need it for severe pain We will inform you when the lab work and xray is back Please go see your surgeon tomorrow.

## 2023-06-20 NOTE — ED Triage Notes (Signed)
 Patient presenting with right hand pain, swelling, and stiffness onset 1 week ago. Pain increasing in the last week. Patient had surgery on the right forearm 05/20/23 and the hand stiffness onset with physical therapy.  Prescriptions or OTC medications tried: Yes- Hydrocodone , muscle relaxer    with little relief.

## 2023-06-24 ENCOUNTER — Ambulatory Visit: Admitting: Orthopedic Surgery

## 2023-06-24 DIAGNOSIS — M25641 Stiffness of right hand, not elsewhere classified: Secondary | ICD-10-CM

## 2023-06-24 MED ORDER — HYDROCODONE-ACETAMINOPHEN 5-325 MG PO TABS: 1.0000 | ORAL_TABLET | ORAL | 0 refills | Status: AC | PRN
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
## 2023-07-02 MED ORDER — HYDROCODONE-ACETAMINOPHEN 5-325 MG PO TABS: 1.0000 | ORAL_TABLET | Freq: Four times a day (QID) | ORAL | 0 refills | Status: AC | PRN

## 2023-07-02 NOTE — Addendum Note (Signed)
 Addended by: Colette Davies on: 07/02/2023 12:54 PM   Modules accepted: Orders

## 2023-07-03 ENCOUNTER — Ambulatory Visit: Admitting: Occupational Therapy
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
## 2023-07-04 ENCOUNTER — Ambulatory Visit: Attending: Orthopedic Surgery | Admitting: Occupational Therapy

## 2023-07-04 ENCOUNTER — Other Ambulatory Visit: Payer: Self-pay

## 2023-07-04 DIAGNOSIS — M25641 Stiffness of right hand, not elsewhere classified: Secondary | ICD-10-CM | POA: Insufficient documentation

## 2023-07-04 DIAGNOSIS — M25631 Stiffness of right wrist, not elsewhere classified: Secondary | ICD-10-CM | POA: Insufficient documentation

## 2023-07-04 DIAGNOSIS — M79644 Pain in right finger(s): Secondary | ICD-10-CM | POA: Diagnosis present

## 2023-07-04 DIAGNOSIS — M6281 Muscle weakness (generalized): Secondary | ICD-10-CM | POA: Insufficient documentation
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
## 2023-07-05 ENCOUNTER — Ambulatory Visit (INDEPENDENT_AMBULATORY_CARE_PROVIDER_SITE_OTHER): Admitting: Orthopedic Surgery

## 2023-07-05 ENCOUNTER — Other Ambulatory Visit (INDEPENDENT_AMBULATORY_CARE_PROVIDER_SITE_OTHER): Payer: Self-pay

## 2023-07-05 DIAGNOSIS — S59911D Unspecified injury of right forearm, subsequent encounter: Secondary | ICD-10-CM

## 2023-07-05 NOTE — Progress Notes (Signed)
 Orthopedic Surgery Post-operative Office Visit   Procedure: right both bone forearm fracture ORIF Date of Surgery: 05/20/2023 (~6 weeks post-op)   Assessment: Patient is a 23 y.o. who has noticed improvement in range of motion and her pain since she was last seen     Plan: -Operative plans complete - Continue to work on range of motion at the fingers particularly flexion and pronation/supination -Has had her first session with OT, encouraged her to continue to work with them -Okay to submerge wounds at this point -No weight bearing >5lbs on the right upper extremity -Pain management: use of hydrocodone  sparingly -Return to office in 3 weeks, x-rays needed at next visit: none   ___________________________________________________________________________     Subjective: Patient has noted significant improvement in her pain since she was last seen in the office.  She is periodically using hydrocodone  at night but otherwise is not using any pain medications.  She has noticed improvement in the range of motion of her fingers.  She has been to 1 occupational therapy session so far.  She says she has been working on exercises at home.  Still has some numbness around the volar aspect of the thumb but it is not as bothersome.  No other numbness or paresthesias.   Objective:   General: no acute distress, appropriate affect Neurologic: alert, answering questions appropriately, following commands Respiratory: unlabored breathing on room air Skin: incisions are well healed with no erythema, induration, active/expressible drainage   -MSK: wrist flexion to 25 degrees and extension to 10 degrees, 3cm short of making composite fist, able to extend fingers fully, able to supinate fully passively, pronation to 20 degrees, AIN/PIN/IO intact, EPL intact, decreased sensation over the volar aspect of the thumb otherwise sensation intact to light touch in median/radial/ulnar nerve distributions, palpable radial  pulse, hand warm and well perfused    Imaging: X-rays of the right forearm from 07/05/2023 were independently reviewed and interpreted, showing comminuted radial shaft fracture with plate fixation.  No lucency around the screws.  No screws backed out.  Fracture appears well reduced with no change in alignment since prior films on 06/05/2023.  There is a comminuted ulnar fracture with plate fixation as well.  No lucency seen around those screws.  None of the screws backed out.  Fracture well reduced with no change in alignment since prior films on/23/25.  No new fracture seen.  No callus formation seen.     Patient name: Laura Parker Patient MRN: [id number] Date of visit: 07/05/23

## 2023-07-10 ENCOUNTER — Ambulatory Visit: Admitting: Occupational Therapy

## 2023-07-16 ENCOUNTER — Ambulatory Visit: Attending: Orthopedic Surgery | Admitting: Occupational Therapy
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
## 2023-07-22 ENCOUNTER — Ambulatory Visit: Admitting: Occupational Therapy

## 2023-07-24 ENCOUNTER — Ambulatory Visit: Admitting: Occupational Therapy

## 2023-07-29 ENCOUNTER — Ambulatory Visit: Admitting: Occupational Therapy

## 2023-07-31 ENCOUNTER — Ambulatory Visit: Admitting: Occupational Therapy

## 2023-08-05 ENCOUNTER — Other Ambulatory Visit (INDEPENDENT_AMBULATORY_CARE_PROVIDER_SITE_OTHER): Payer: Self-pay

## 2023-08-05 ENCOUNTER — Ambulatory Visit: Admitting: Occupational Therapy

## 2023-08-05 ENCOUNTER — Ambulatory Visit (INDEPENDENT_AMBULATORY_CARE_PROVIDER_SITE_OTHER): Admitting: Orthopedic Surgery

## 2023-08-05 DIAGNOSIS — S59911D Unspecified injury of right forearm, subsequent encounter: Secondary | ICD-10-CM | POA: Diagnosis not present
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
## 2023-08-05 NOTE — Progress Notes (Signed)
 Orthopedic Surgery Post-operative Office Visit   Procedure: right both bone forearm fracture ORIF Date of Surgery: 05/20/2023 (~10 weeks post-op)   Assessment: Patient is a 23 y.o. who doing well after surgery.  Still with some stiffness but has made significant improvement since our last office visit     Plan: -Operative plans complete -Explained that she is still lacking some wrist extension and forearm pronation, encouraged her to work on those at home -Weight bearing as tolerated -Pain management: tylenol  as needed -Return to office in 4 weeks, x-rays needed at next visit: none   ___________________________________________________________________________     Subjective: Patient feels that she is doing much better.  She is not having any pain in her forearm.  She was unable to go to occupational therapy but was working on exercise at home.  She feels that her range of motion has improved since she was last seen in the office.  No complaints at today's visit.   Objective:   General: no acute distress, appropriate affect Neurologic: alert, answering questions appropriately, following commands Respiratory: unlabored breathing on room air Skin: incisions are well healed   -MSK: wrist flexion to 50 degrees and extension to 40 degrees, able to make composite fist, able to extend fingers fully, able to oppose thumb and little finger ,full supination, lacking 15 degrees to get to full pronation, AIN/PIN/IO intact, EPL intact, decreased sensation over the volar aspect of the thumb otherwise sensation intact to light touch in median/radial/ulnar nerve distributions, palpable radial pulse, hand warm and well perfused    Imaging: X-rays of the right forearm from 08/05/2023 were independently reviewed and interpreted, showing comminuted radial and ulnar shaft fractures. Plate fixation seen over the ulna and radius. No lucency seen around the screws. None of the screws have backed out. Fractures  well reduced with no interval change in alignment. No callus formation seen. No new fracture seen.     Patient name: Laura Parker Patient MRN: [id number] Date of visit: 08/05/23

## 2023-08-06 ENCOUNTER — Encounter: Payer: Self-pay | Admitting: Orthopedic Surgery

## 2023-08-07 ENCOUNTER — Ambulatory Visit: Admitting: Occupational Therapy

## 2023-08-12 ENCOUNTER — Ambulatory Visit: Admitting: Occupational Therapy

## 2023-08-14 ENCOUNTER — Ambulatory Visit: Payer: Self-pay | Admitting: Occupational Therapy

## 2023-09-05 ENCOUNTER — Ambulatory Visit: Admitting: Orthopedic Surgery

## 2023-09-20 ENCOUNTER — Encounter: Payer: Self-pay | Admitting: Orthopedic Surgery

## 2023-09-23 ENCOUNTER — Encounter: Payer: Self-pay | Admitting: Radiology

## 2023-09-27 MED ORDER — TRAMADOL HCL 50 MG PO TABS: 50.0000 mg | ORAL_TABLET | Freq: Four times a day (QID) | ORAL | 0 refills | Status: AC | PRN

## 2023-10-03 ENCOUNTER — Telehealth: Payer: Self-pay | Admitting: Orthopedic Surgery

## 2023-10-03 ENCOUNTER — Other Ambulatory Visit (INDEPENDENT_AMBULATORY_CARE_PROVIDER_SITE_OTHER): Payer: Self-pay

## 2023-10-03 ENCOUNTER — Encounter: Payer: Self-pay | Admitting: Radiology

## 2023-10-03 ENCOUNTER — Ambulatory Visit (INDEPENDENT_AMBULATORY_CARE_PROVIDER_SITE_OTHER): Admitting: Orthopedic Surgery

## 2023-10-03 DIAGNOSIS — S63641A Sprain of metacarpophalangeal joint of right thumb, initial encounter: Secondary | ICD-10-CM

## 2023-10-03 DIAGNOSIS — S63649A Sprain of metacarpophalangeal joint of unspecified thumb, initial encounter: Secondary | ICD-10-CM

## 2023-10-03 DIAGNOSIS — M25641 Stiffness of right hand, not elsewhere classified: Secondary | ICD-10-CM | POA: Diagnosis not present

## 2023-10-03 NOTE — Telephone Encounter (Signed)
 Pt called stating she seen Laura Parker today. Pt asked for a work note for today and she took off Wednesday and asked if Wednesday can be added due to her pain unable to work. Pt is asking for letter to be sent to her mychart.Pt phone number is [phone]

## 2023-10-03 NOTE — Progress Notes (Signed)
 Orthopedic Surgery Post-operative Office Visit   Procedure: right both bone forearm fracture ORIF Date of Surgery: 05/20/2023 (~14 weeks post-op)   Assessment: Patient is a 23 y.o. who doing well after surgery.  Still with some stiffness but has made significant improvement since our last office visit     Plan: -Operative plans complete -Patient has noticed thumb pain at the end of work. She appears to have a small avulsion fracture near the ulnar side of her thumb MCP joint. She has laxity on exam -Patient has had greater than 6 weeks of thumb pain and swelling and has tried a thumb spica splint and tylenol  without any relief so recommended MRI of the hand -Should continue with the thumb spica and tylenol . Will determine next steps in management based on MRI results -Return to office in 8 weeks, x-rays needed at next visit: AP/lateral right forearm   ___________________________________________________________________________     Subjective: Patient is not having any pain in her forearm but has noticed pain in her right thumb. She has been back to work and notices it at the end of the day. 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 She said her thumb will be painful, swell, and throb by the end of her work day. She does not have any numbness or paresthesias. No recent trauma or injuries.    Objective:   General: no acute distress, appropriate affect Neurologic: alert, answering questions appropriately, following commands Respiratory: unlabored breathing on room air Skin: incisions are well healed   -MSK: wrist flexion to 50 degrees and extension to 40 degrees, able to make composite fist, able to extend fingers fully, able to oppose thumb and little finger ,full supination, lacking 15 degrees to get to full pronation, AIN/PIN/IO intact, EPL intact, sensation intact to light touch in median/ulnar/radial nerve distributions, laxity with radial stress at the thumb  MCP joint, no laxity with ulnar stress at that joint   Imaging: X-rays of the right forearm from 08/05/2023 were independently reviewed and interpreted, showing comminuted radial and ulnar shaft fractures. Plate fixation seen over the ulna and radius. No lucency seen around the screws. None of the screws have backed out. Fractures well reduced with no interval change in alignment. No callus formation seen. No new fracture seen.   XRs of the right hand from 10/03/2023 were independently reviewed and interpreted, showing a small avulsion fracture at the ulnar aspect of the thumb MCP joint. No other fracture seen. No dislocation seen. No degenerative changes seen within the hand.     Patient name: Laura Parker Patient MRN: [id number] Date of visit: 10/03/23

## 2023-10-03 NOTE — Telephone Encounter (Signed)
 Note sent to MyChart.

## 2023-10-07 ENCOUNTER — Encounter: Payer: Self-pay | Admitting: Orthopedic Surgery

## 2023-10-11 NOTE — Telephone Encounter (Signed)
 Noted. Will process when forms are received.

## 2023-10-15 ENCOUNTER — Ambulatory Visit
Admission: RE | Admit: 2023-10-15 | Discharge: 2023-10-15 | Disposition: A | Discharge status: Home / Self Care | Source: Ambulatory Visit | Attending: Orthopedic Surgery | Admitting: Orthopedic Surgery

## 2023-10-15 DIAGNOSIS — S63649A Sprain of metacarpophalangeal joint of unspecified thumb, initial encounter: Secondary | ICD-10-CM

## 2023-10-17 ENCOUNTER — Telehealth: Payer: Self-pay | Admitting: Orthopedic Surgery

## 2023-10-17 NOTE — Telephone Encounter (Signed)
 Pt came in and dropped off a Accommodation Form to be completed by the provider and would like to be called to pick it up once completed. Pt paid $20.00 (Cash) Form Fee.  Placed the form in the tray for Tammy.

## 2023-10-24 NOTE — Telephone Encounter (Signed)
 I received this message earlier this am. I called patient at 7:50 and advised forms complete for accommodations for job position change. She is picking up this am.

## 2023-10-30 ENCOUNTER — Encounter: Payer: Self-pay | Admitting: Orthopedic Surgery

## 2023-10-30 DIAGNOSIS — S63641A Sprain of metacarpophalangeal joint of right thumb, initial encounter: Secondary | ICD-10-CM

## 2023-11-11 ENCOUNTER — Ambulatory Visit: Admitting: Orthopedic Surgery

## 2023-11-21 ENCOUNTER — Ambulatory Visit: Admitting: Orthopedic Surgery

## 2023-11-21 DIAGNOSIS — S63641A Sprain of metacarpophalangeal joint of right thumb, initial encounter: Secondary | ICD-10-CM
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
## 2023-11-23 ENCOUNTER — Ambulatory Visit (HOSPITAL_COMMUNITY)
Admission: EM | Admit: 2023-11-23 | Discharge: 2023-11-23 | Disposition: A | Discharge status: Home / Self Care | Attending: Emergency Medicine | Admitting: Emergency Medicine

## 2023-11-23 ENCOUNTER — Encounter (HOSPITAL_COMMUNITY): Payer: Self-pay

## 2023-11-23 DIAGNOSIS — H00025 Hordeolum internum left lower eyelid: Secondary | ICD-10-CM

## 2023-11-23 MED ORDER — ERYTHROMYCIN 5 MG/GM OP OINT: 1.0000 | TOPICAL_OINTMENT | Freq: Four times a day (QID) | OPHTHALMIC | 0 refills | Status: AC

## 2023-11-23 NOTE — ED Provider Notes (Signed)
 [facility]    CSN: [id number] Arrival date & time: 11/23/23  1034      History   Chief Complaint Chief Complaint  Patient presents with   Eye Problem    HPI Laura Parker is a 23 y.o. female.   Patient presents with left lower eyelid pain and swelling that began yesterday.  Patient denies any drainage from this area.  Patient denies any visual disturbances or eye redness.  Patient denies any recent injuries to her eye.  The history is provided by the patient and medical records.  Eye Problem   Past Medical History:  Diagnosis Date   Depression     Patient Active Problem List   Diagnosis Date Noted   Forearm fractures, both bones, closed, right, initial encounter 05/20/2023   Laceration of skin of right hand 05/20/2023   Severe episode of recurrent major depressive disorder, without psychotic features [facility])     Past Surgical History:  Procedure Laterality Date   ORIF RADIAL FRACTURE Right 05/20/2023   Procedure: ORIF RIGHT RADIUS AND ULNA FRACTURE, IRRIGATION AND CLOSURE OF HAND LACERATION;  Surgeon: Georgina Ozell LABOR, MD;  Location: [facility] OR;  Service: Orthopedics;  Laterality: Right;  IRRIGATION AND DEBRIDMENT OF RIGHT FOREARM    OB History   No obstetric history on file.      Home Medications    Prior to Admission medications   Medication Sig Start Date End Date Taking? Authorizing Provider  erythromycin ophthalmic ointment Place 1 Application into the right eye 4 (four) times daily for 5 days. Place a 1/2 inch ribbon of ointment into the lower eyelid. 11/23/23 11/28/23 Yes [name] LABOR, NP    Family History History reviewed. No pertinent family history.  Social History Social History   Tobacco Use   Smoking status: Every Day    Types: Cigars   Smokeless tobacco: Never  Vaping Use   Vaping status: Never Used  Substance Use Topics   Alcohol use: Yes    Comment: occasionally   Drug use: Never     Allergies    Chlorine   Review of Systems Review of Systems  Per HPI  Physical Exam Triage Vital Signs ED Triage Vitals  Encounter Vitals Group     BP 11/23/23 1058 110/73     Girls Systolic BP Percentile --      Girls Diastolic BP Percentile --      Boys Systolic BP Percentile --      Boys Diastolic BP Percentile --      Pulse Rate 11/23/23 1058 77     Resp 11/23/23 1058 16     Temp 11/23/23 1058 98.7 F (37.1 C)     Temp Source 11/23/23 1058 Oral     SpO2 11/23/23 1058 96 %     Weight --      Height --      Head Circumference --      Peak Flow --      Pain Score 11/23/23 1057 6     Pain Loc --      Pain Education --      Exclude from Growth Chart --    No data found.  Updated Vital Signs BP 110/73 (BP Location: Left Arm)   Pulse 77   Temp 98.7 F (37.1 C) (Oral)   Resp 16   LMP 10/19/2023 (Approximate)   SpO2 96%   Visual Acuity Right Eye Distance:   Left Eye Distance:   Bilateral Distance:  Right Eye Near:   Left Eye Near:    Bilateral Near:     Physical Exam Vitals and nursing note reviewed.  Constitutional:      General: She is awake. She is not in acute distress.    Appearance: Normal appearance. She is well-developed and well-groomed. She is not ill-appearing.  Eyes:     General:        Left eye: Hordeolum present.    Extraocular Movements: Extraocular movements intact.     Conjunctiva/sclera: Conjunctivae normal.     Pupils: Pupils are equal, round, and reactive to light.     Comments: Internal hordeolum noted to left lower eyelid  Neurological:     Mental Status: She is alert.  Psychiatric:        Behavior: Behavior is cooperative.      UC Treatments / Results  Labs (all labs ordered are listed, but only abnormal results are displayed) Labs Reviewed - No data to display  EKG   Radiology No results found.  Procedures Procedures (including critical care time)  Medications Ordered in UC Medications - No data to display  Initial  Impression / Assessment and Plan / UC Course  I have reviewed the triage vital signs and the nursing notes.  Pertinent labs & imaging results that were available during my care of the patient were reviewed by me and considered in my medical decision making (see chart for details).     Patient is overall well-appearing.  Vitals are stable.  Prescribed erythromycin ointment for infection prevention related to presence of internal stye.  Recommended warm compresses.  Discussed follow-up and return precautions. Final Clinical Impressions(s) / UC Diagnoses   Final diagnoses:  Hordeolum internum of left lower eyelid     Discharge Instructions      Apply warm compresses (warm wet washcloth) tonight to help decrease swelling and presence of stye. You can apply erythromycin ointment 4 times daily to the lower eyelid for infection prevention related to this. Follow-up with your primary care provider or return here as needed.   ED Prescriptions     Medication Sig Dispense Auth. Provider   erythromycin ophthalmic ointment Place 1 Application into the right eye 4 (four) times daily for 5 days. Place a 1/2 inch ribbon of ointment into the lower eyelid. 3.5 g [name], NP      PDMP not reviewed this encounter.   [name], NP 11/23/23 1124

## 2023-11-23 NOTE — ED Triage Notes (Signed)
 Patient here today with c/o left eye pain and swelling since yesterday morning. Patient states that she also has a headache. Patient has taken Tylenol  with no relief.

## 2023-11-23 NOTE — Discharge Instructions (Addendum)
 Apply warm compresses (warm wet washcloth) tonight to help decrease swelling and presence of stye. You can apply erythromycin ointment 4 times daily to the lower eyelid for infection prevention related to this. Follow-up with your primary care provider or return here as needed.

## 2023-11-24 ENCOUNTER — Ambulatory Visit (HOSPITAL_COMMUNITY): Payer: Self-pay

## 2023-11-26 ENCOUNTER — Encounter: Payer: Self-pay | Admitting: Orthopedic Surgery

## 2023-11-28 ENCOUNTER — Encounter: Payer: Self-pay | Admitting: Orthopedic Surgery

## 2023-11-29 ENCOUNTER — Other Ambulatory Visit: Payer: Self-pay

## 2023-11-29 ENCOUNTER — Encounter (HOSPITAL_BASED_OUTPATIENT_CLINIC_OR_DEPARTMENT_OTHER): Payer: Self-pay | Admitting: Orthopedic Surgery

## 2023-12-02 ENCOUNTER — Other Ambulatory Visit: Payer: Self-pay

## 2023-12-02 DIAGNOSIS — S63641A Sprain of metacarpophalangeal joint of right thumb, initial encounter: Secondary | ICD-10-CM

## 2023-12-03 ENCOUNTER — Encounter (HOSPITAL_BASED_OUTPATIENT_CLINIC_OR_DEPARTMENT_OTHER)
Admission: RE | Admit: 2023-12-03 | Discharge: 2023-12-03 | Disposition: A | Discharge status: Home / Self Care | Source: Ambulatory Visit | Attending: Orthopedic Surgery | Admitting: Orthopedic Surgery

## 2023-12-03 DIAGNOSIS — Z01812 Encounter for preprocedural laboratory examination: Secondary | ICD-10-CM | POA: Insufficient documentation

## 2023-12-03 LAB — POCT PREGNANCY, URINE: Preg Test, Ur: NEGATIVE

## 2023-12-04 ENCOUNTER — Telehealth: Payer: Self-pay | Admitting: Orthopedic Surgery

## 2023-12-04 NOTE — Telephone Encounter (Signed)
 Patient called and said she needs a note for today just prepare for surgery tomorrow. [phone]

## 2023-12-05 ENCOUNTER — Ambulatory Visit: Admitting: Orthopedic Surgery

## 2023-12-06 ENCOUNTER — Other Ambulatory Visit: Payer: Self-pay

## 2023-12-06 ENCOUNTER — Ambulatory Visit (HOSPITAL_BASED_OUTPATIENT_CLINIC_OR_DEPARTMENT_OTHER): Admitting: Certified Registered"

## 2023-12-06 ENCOUNTER — Encounter (HOSPITAL_BASED_OUTPATIENT_CLINIC_OR_DEPARTMENT_OTHER): Admission: RE | Disposition: A | Payer: Self-pay | Source: Home / Self Care | Attending: Orthopedic Surgery

## 2023-12-06 ENCOUNTER — Encounter (HOSPITAL_BASED_OUTPATIENT_CLINIC_OR_DEPARTMENT_OTHER): Payer: Self-pay | Admitting: Orthopedic Surgery

## 2023-12-06 ENCOUNTER — Ambulatory Visit (HOSPITAL_BASED_OUTPATIENT_CLINIC_OR_DEPARTMENT_OTHER)
Admission: RE | Admit: 2023-12-06 | Discharge: 2023-12-06 | Disposition: A | Discharge status: Home / Self Care | Attending: Orthopedic Surgery | Admitting: Orthopedic Surgery

## 2023-12-06 ENCOUNTER — Other Ambulatory Visit: Payer: Self-pay | Admitting: Orthopedic Surgery

## 2023-12-06 ENCOUNTER — Ambulatory Visit (HOSPITAL_BASED_OUTPATIENT_CLINIC_OR_DEPARTMENT_OTHER)

## 2023-12-06 DIAGNOSIS — F32A Depression, unspecified: Secondary | ICD-10-CM | POA: Diagnosis not present

## 2023-12-06 DIAGNOSIS — S63641A Sprain of metacarpophalangeal joint of right thumb, initial encounter: Secondary | ICD-10-CM | POA: Diagnosis not present

## 2023-12-06 DIAGNOSIS — F17290 Nicotine dependence, other tobacco product, uncomplicated: Secondary | ICD-10-CM | POA: Diagnosis not present

## 2023-12-06 DIAGNOSIS — Z01818 Encounter for other preprocedural examination: Secondary | ICD-10-CM

## 2023-12-06 DIAGNOSIS — S63641D Sprain of metacarpophalangeal joint of right thumb, subsequent encounter: Secondary | ICD-10-CM

## 2023-12-06 HISTORY — PX: RECONSTRUCTION, LIGAMENT, COLLATERAL, MCP JOINT: SHX7626

## 2023-12-06 SURGERY — RECONSTRUCTION, LIGAMENT, COLLATERAL, MCP JOINT
Anesthesia: Regional | Site: Thumb | Laterality: Right

## 2023-12-06 MED ORDER — EPHEDRINE SULFATE (PRESSORS) 25 MG/5ML IV SOSY
PREFILLED_SYRINGE | INTRAVENOUS | Status: DC | PRN
Start: 2023-12-06 — End: 2023-12-06
  Administered 2023-12-06 (×2): 5 mg via INTRAVENOUS

## 2023-12-06 MED ORDER — FENTANYL CITRATE (PF) 100 MCG/2ML IJ SOLN: 25.0000 ug | INTRAMUSCULAR | Status: DC | PRN

## 2023-12-06 MED ORDER — EPHEDRINE 5 MG/ML INJ
INTRAVENOUS | Status: AC
  Filled 2023-12-06: qty 10

## 2023-12-06 MED ORDER — OXYCODONE HCL 5 MG/5ML PO SOLN: 5.0000 mg | Freq: Once | ORAL | Status: DC | PRN

## 2023-12-06 MED ORDER — ACETAMINOPHEN 500 MG PO TABS
1000.0000 mg | ORAL_TABLET | Freq: Once | ORAL | Status: AC
  Administered 2023-12-06: 1000 mg via ORAL

## 2023-12-06 MED ORDER — OXYCODONE HCL 5 MG PO TABS: 5.0000 mg | ORAL_TABLET | Freq: Once | ORAL | Status: DC | PRN

## 2023-12-06 MED ORDER — MIDAZOLAM HCL 2 MG/2ML IJ SOLN
INTRAMUSCULAR | Status: AC
  Filled 2023-12-06: qty 2

## 2023-12-06 MED ORDER — PROPOFOL 500 MG/50ML IV EMUL
INTRAVENOUS | Status: DC | PRN
  Administered 2023-12-06 (×3): 20 mg via INTRAVENOUS
  Administered 2023-12-06: 100 ug/kg/min via INTRAVENOUS

## 2023-12-06 MED ORDER — ONDANSETRON HCL 4 MG/2ML IJ SOLN
INTRAMUSCULAR | Status: AC
  Filled 2023-12-06: qty 2

## 2023-12-06 MED ORDER — MIDAZOLAM HCL (PF) 2 MG/2ML IJ SOLN
2.0000 mg | Freq: Once | INTRAMUSCULAR | Status: AC
Start: 2023-12-06 — End: 2023-12-06
  Administered 2023-12-06: 2 mg via INTRAVENOUS

## 2023-12-06 MED ORDER — FENTANYL CITRATE (PF) 100 MCG/2ML IJ SOLN
INTRAMUSCULAR | Status: DC | PRN
  Administered 2023-12-06: 50 ug via INTRAVENOUS

## 2023-12-06 MED ORDER — 0.9 % SODIUM CHLORIDE (POUR BTL) OPTIME
TOPICAL | Status: DC | PRN
  Administered 2023-12-06: 500 mL

## 2023-12-06 MED ORDER — AMISULPRIDE (ANTIEMETIC) 5 MG/2ML IV SOLN: 10.0000 mg | Freq: Once | INTRAVENOUS | Status: DC | PRN

## 2023-12-06 MED ORDER — KETOROLAC TROMETHAMINE 30 MG/ML IJ SOLN: 30.0000 mg | Freq: Once | INTRAMUSCULAR | Status: DC | PRN

## 2023-12-06 MED ORDER — OXYCODONE HCL 5 MG PO TABS: 5.0000 mg | ORAL_TABLET | Freq: Four times a day (QID) | ORAL | 0 refills | Status: DC | PRN

## 2023-12-06 MED ORDER — BUPIVACAINE-EPINEPHRINE (PF) 0.5% -1:200000 IJ SOLN
INTRAMUSCULAR | Status: DC | PRN
  Administered 2023-12-06: 30 mL via PERINEURAL

## 2023-12-06 MED ORDER — ONDANSETRON HCL 4 MG/2ML IJ SOLN
INTRAMUSCULAR | Status: DC | PRN
  Administered 2023-12-06: 4 mg via INTRAVENOUS

## 2023-12-06 MED ORDER — ACETAMINOPHEN 500 MG PO TABS
ORAL_TABLET | ORAL | Status: AC
  Filled 2023-12-06: qty 2

## 2023-12-06 MED ORDER — CEFAZOLIN SODIUM-DEXTROSE 2-4 GM/100ML-% IV SOLN
2.0000 g | INTRAVENOUS | Status: AC
  Administered 2023-12-06: 2 g via INTRAVENOUS

## 2023-12-06 MED ORDER — LACTATED RINGERS IV SOLN
INTRAVENOUS | Status: DC
Start: 2023-12-06 — End: 2023-12-06

## 2023-12-06 MED ORDER — LIDOCAINE HCL (PF) 1 % IJ SOLN
INTRAMUSCULAR | Status: DC | PRN
  Administered 2023-12-06: 10 mL

## 2023-12-06 MED ORDER — CEFAZOLIN SODIUM-DEXTROSE 2-4 GM/100ML-% IV SOLN
INTRAVENOUS | Status: AC
  Filled 2023-12-06: qty 100

## 2023-12-06 MED ORDER — FENTANYL CITRATE (PF) 100 MCG/2ML IJ SOLN
INTRAMUSCULAR | Status: AC
  Filled 2023-12-06: qty 2

## 2023-12-06 SURGICAL SUPPLY — 57 items
ANCHOR REPAIR HAND WRIST (Anchor) IMPLANT
BLADE ARTHRO LOK 4 BEAVER (BLADE) ×1 IMPLANT
BLADE SURG 15 STRL LF DISP TIS (BLADE) ×2 IMPLANT
BNDG COHESIVE 4X5 TAN STRL LF (GAUZE/BANDAGES/DRESSINGS) ×1 IMPLANT
BNDG COMPR ESMARK 4X3 LF (GAUZE/BANDAGES/DRESSINGS) ×1 IMPLANT
BNDG ELASTIC 3INX 5YD STR LF (GAUZE/BANDAGES/DRESSINGS) IMPLANT
BNDG ELASTIC 4INX 5YD STR LF (GAUZE/BANDAGES/DRESSINGS) ×2 IMPLANT
BNDG GAUZE DERMACEA FLUFF 4 (GAUZE/BANDAGES/DRESSINGS) ×1 IMPLANT
CHLORAPREP W/TINT 26 (MISCELLANEOUS) ×2 IMPLANT
CORD BIPOLAR FORCEPS 12FT (ELECTRODE) ×1 IMPLANT
COVER BACK TABLE 60X90IN (DRAPES) ×1 IMPLANT
CUFF TOURN SGL QUICK 18X4 (TOURNIQUET CUFF) IMPLANT
DERMABOND ADVANCED .7 DNX12 (GAUZE/BANDAGES/DRESSINGS) IMPLANT
DRAPE HAND 77X146 (DRAPES) ×1 IMPLANT
DRAPE IMP U-DRAPE 54X76 (DRAPES) ×1 IMPLANT
DRAPE OEC MINIVIEW 54X84 (DRAPES) ×1 IMPLANT
DRAPE SURG 17X23 STRL (DRAPES) ×1 IMPLANT
GAUZE PAD ABD 8X10 STRL (GAUZE/BANDAGES/DRESSINGS) ×1 IMPLANT
GAUZE SPONGE 4X4 12PLY STRL (GAUZE/BANDAGES/DRESSINGS) ×1 IMPLANT
GAUZE STRETCH 2X75IN STRL (MISCELLANEOUS) ×1 IMPLANT
GAUZE XEROFORM 1X8 LF (GAUZE/BANDAGES/DRESSINGS) ×1 IMPLANT
GLOVE BIO SURGEON STRL SZ7 (GLOVE) IMPLANT
GLOVE BIO SURGEON STRL SZ7.5 (GLOVE) ×1 IMPLANT
GLOVE BIOGEL M 7.0 STRL (GLOVE) IMPLANT
GLOVE BIOGEL PI IND STRL 7.0 (GLOVE) IMPLANT
GLOVE BIOGEL PI IND STRL 7.5 (GLOVE) ×1 IMPLANT
GOWN STRL REUS W/ TWL LRG LVL3 (GOWN DISPOSABLE) ×2 IMPLANT
GOWN STRL REUS W/ TWL XL LVL3 (GOWN DISPOSABLE) IMPLANT
GOWN STRL REUS W/TWL XL LVL3 (GOWN DISPOSABLE) IMPLANT
GOWN STRL SURGICAL XL XLNG (GOWN DISPOSABLE) ×1 IMPLANT
MANIFOLD NEPTUNE II (INSTRUMENTS) ×1 IMPLANT
NDL HYPO 25X1 1.5 SAFETY (NEEDLE) IMPLANT
NDL HYPO 25X5/8 SAFETYGLIDE (NEEDLE) IMPLANT
NDL SUT 6 .5 CRC .975X.05 MAYO (NEEDLE) IMPLANT
NEEDLE HYPO 25X1 1.5 SAFETY (NEEDLE) IMPLANT
NEEDLE HYPO 25X5/8 SAFETYGLIDE (NEEDLE) ×1 IMPLANT
PACK BASIN DAY SURGERY FS (CUSTOM PROCEDURE TRAY) ×1 IMPLANT
PAD CAST 3X4 CTTN HI CHSV (CAST SUPPLIES) ×1 IMPLANT
SHEET MEDIUM DRAPE 40X70 STRL (DRAPES) ×1 IMPLANT
SLING ARM FOAM STRAP LRG (SOFTGOODS) IMPLANT
SPIKE FLUID TRANSFER (MISCELLANEOUS) IMPLANT
SPLINT PLASTER CAST XFAST 4X15 (CAST SUPPLIES) ×1 IMPLANT
SPONGE SURGIFOAM ABS GEL 12-7 (HEMOSTASIS) IMPLANT
STOCKINETTE IMPERVIOUS 9X36 MD (GAUZE/BANDAGES/DRESSINGS) ×1 IMPLANT
SUCTION TUBE FRAZIER 10FR DISP (SUCTIONS) IMPLANT
SUT ETHIBOND 0 MO6 C/R (SUTURE) IMPLANT
SUT ETHILON 4 0 PS 2 18 (SUTURE) IMPLANT
SUT MNCRL AB 3-0 PS2 27 (SUTURE) IMPLANT
SUT MNCRL AB 4-0 PS2 18 (SUTURE) IMPLANT
SUT VIC AB 3-0 PS2 18XBRD (SUTURE) IMPLANT
SUTURE 0 FIBERLP 38 BLU TPR ND (SUTURE) IMPLANT
SYR BULB EAR ULCER 3OZ GRN STR (SYRINGE) ×2 IMPLANT
SYR CONTROL 10ML LL (SYRINGE) IMPLANT
TAPE SUT LABRALTAP WHT/BLK (SUTURE) IMPLANT
TOWEL GREEN STERILE FF (TOWEL DISPOSABLE) ×2 IMPLANT
TUBE CONNECTING 20X1/4 (TUBING) IMPLANT
UNDERPAD 30X36 HEAVY ABSORB (UNDERPADS AND DIAPERS) ×1 IMPLANT
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
## 2023-12-06 NOTE — Discharge Instructions (Addendum)
 Hand Surgery Postop Instructions   Dressings: Maintain postoperative dressing until orthopedic follow-up.  Keep operative site clean and dry until orthopedic follow-up.  Wound Care: Keep your hand elevated above the level of your heart.  Do not allow it to dangle by your side. Moving your fingers is advised to stimulate circulation but will depend on the site of your surgery.  If you have a splint applied, your doctor will advise you regarding movement.  Activity: Do not drive or operate machinery until clearance given from physician. No heavy lifting with operative extremity.  Diet:  Drink liquids today or eat a light diet.  You may resume a regular diet tomorrow.    General expectations: Take prescribed medication if given, transition to over-the-counter medication as quickly as possible. Fingers may become slightly swollen.  Call your doctor if any of the following occur: Severe pain not relieved by pain medication. Elevated temperature. Dressing soaked with blood. Inability to move fingers. White or bluish color to fingers.   Per [facility] clinic policy, our goal is ensure optimal postoperative pain control with a multimodal pain management strategy. For all [facility] patients, our goal is to wean post-operative narcotic medications by 6 weeks post-operatively. If this is not possible due to utilization of pain medication prior to surgery, your [employer] doctor will support your acute post-operative pain control for the first 6 weeks postoperatively, with a plan to transition you back to your primary pain team following that. Laura Parker will work to ensure a Therapist, occupational.  Anshul Afton Alderton, M.D. Hand Surgery [facility]    Post Anesthesia Home Care Instructions  Activity: Get plenty of rest for the remainder of the day. A responsible individual must stay with you for 24 hours following the procedure.  For the next 24 hours, DO NOT: -Drive a  car -Advertising copywriter -Drink alcoholic beverages -Take any medication unless instructed by your physician -Make any legal decisions or sign important papers.  Meals: Start with liquid foods such as gelatin or soup. Progress to regular foods as tolerated. Avoid greasy, spicy, heavy foods. If nausea and/or vomiting occur, drink only clear liquids until the nausea and/or vomiting subsides. Call your physician if vomiting continues.  Special Instructions/Symptoms: Your throat may feel dry or sore from the anesthesia or the breathing tube placed in your throat during surgery. If this causes discomfort, gargle with warm salt water. The discomfort should disappear within 24 hours.  If you had a scopolamine patch placed behind your ear for the management of post- operative nausea and/or vomiting:  1. The medication in the patch is effective for 72 hours, after which it should be removed.  Wrap patch in a tissue and discard in the trash. Wash hands thoroughly with soap and water. 2. You may remove the patch earlier than 72 hours if you experience unpleasant side effects which may include dry mouth, dizziness or visual disturbances. 3. Avoid touching the patch. Wash your hands with soap and water after contact with the patch.     Regional Anesthesia Blocks  1. You may not be able to move or feel the blocked extremity after a regional anesthetic block. This may last may last from 3-48 hours after placement, but it will go away. The length of time depends on the medication injected and your individual response to the medication. As the nerves start to wake up, you may experience tingling as the movement and feeling returns to your extremity. If the numbness and inability to  move your extremity has not gone away after 48 hours, please call your surgeon.   2. The extremity that is blocked will need to be protected until the numbness is gone and the strength has returned. Because you cannot feel it, you  will need to take extra care to avoid injury. Because it may be weak, you may have difficulty moving it or using it. You may not know what position it is in without looking at it while the block is in effect.  3. For blocks in the legs and feet, returning to weight bearing and walking needs to be done carefully. You will need to wait until the numbness is entirely gone and the strength has returned. You should be able to move your leg and foot normally before you try and bear weight or walk. You will need someone to be with you when you first try to ensure you do not fall and possibly risk injury.  4. Bruising and tenderness at the needle site are common side effects and will resolve in a few days.  5. Persistent numbness or new problems with movement should be communicated to the surgeon or the [facility] [phone] [facility] [phone]).  No tylenol  until after 230pm

## 2023-12-06 NOTE — Op Note (Signed)
 NAME: Laura Parker MEDICAL RECORD NO: [id number] DATE OF BIRTH: 11-Feb-2001 FACILITY: [facility] LOCATION: [facility] PHYSICIAN: GILDARDO ALDERTON, MD   OPERATIVE REPORT   DATE OF PROCEDURE: 12/06/23    PREOPERATIVE DIAGNOSIS: Right thumb ulnar collateral ligament injury   POSTOPERATIVE DIAGNOSIS: Right thumb ulnar collateral ligament injury   PROCEDURE: Right thumb ulnar collateral ligament reconstruction with internal brace augmentation   SURGEON:  GILDARDO ALDERTON, M.D.   ASSISTANT: Joesph Dinsmore, OPA   ANESTHESIA:  Regional with sedation   INTRAVENOUS FLUIDS:  Per anesthesia flow sheet.   ESTIMATED BLOOD LOSS:  Minimal.   COMPLICATIONS:  None.   SPECIMENS:  none   TOURNIQUET TIME:    Total Tourniquet Time Documented: Upper Arm (Right) - 24 minutes Total: Upper Arm (Right) - 24 minutes    DISPOSITION:  Stable to PACU.   INDICATIONS: 23 year old female who sustained a traumatic injury to the left thumb with concern for left thumb ulnar collateral ligament rupture.  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  In comparison to the contralateral side, there is no stable endpoint with stress testing of the thumb UCL at 30 degrees of flexion and neutral.   Given the MRI workup and notable instability on examination patient had clear indication for right thumb UCL reconstruction with internal brace augmentation.  We discussed the surgical protocol for right thumb UCL, augmentation with internal brace and appropriate timeline for return to activities.   Risks and benefits of the procedure were discussed, risks including but not limited to infection, bleeding, scarring, stiffness, nerve injury, tendon injury,  vascular injury, hardware complication, recurrence of symptoms and need for subsequent operation.  We also discussed the appropriate postoperative protocol and timeframe for return to activities and function.  Patient expressed understanding and elected to proceed.  OPERATIVE COURSE: Patient was seen and identified in the preoperative area and marked appropriately.  Surgical consent had been signed. Preoperative IV antibiotic prophylaxis was given. She was transferred to the operating room and placed in supine position with the Right upper extremity on an arm board.  General anesthesia was induced by the anesthesiologist.  Right upper extremity was prepped and draped in normal sterile orthopedic fashion.  A surgical pause was performed between the surgeons, anesthesia, and operating room staff and all were in agreement as to the patient, procedure, and site of procedure.  Tourniquet was placed and padded appropriately to the right upper extremity.  The arm was exsanguinated and the tourniquet was inflated to 250 mmHg.  Curvilinear incision was designed over the ulnar aspect of the MCP of the thumb.  Skin flaps were elevated, care was taken to protect neurovascular structures including the dorsal sensory branch of the radial nerve.   Adductor aponeurosis was retracted to identify the torn UCL which was attached to the avulsion fracture of the proximal phalanx of the thumb.  The stump of the ligament was identified and mobilized.  The distal insertion had been avulsed from the base of the proximal phalanx.  The bony insertion point of the proximal phalanx was identified and debrided.  Suture anchor for the internal brace device was then inserted with 3-0 FiberWire attached, we utilized a 3.5 mm swivel lock implant.  This was inserted into the proximal phalanx insertional site,  3-0 FiberWire was then used for direct repair of the torn UCL and back to its insertional site.  After this, gentle dissection was  performed at the thumb metacarpal insertional site of the UCL.  Additional anchor insertional site was found at the appropriate thumb metacarpal region.  Labral tape from the internal brace device was then placed over the repair site and buried with an additional 3.5 mm swivel lock suture anchor into the thumb metacarpal just proximal to the UCL origin.   At this juncture, stress testing of the thumb UCL was performed at both neutral and 30 degrees of flexion at the MCP joint.  Stable endpoints were appreciated with excellent stability.  Tourniquet was subsequently deflated and copious irrigation was performed.  Tourniquet time was 24 minutes.  Bipolar cautery was utilized hemostasis.  Capsular tissue of the MCP joint was closed utilizing 3-0 Vicryl in figure-of-eight fashion.  Layered closure was then performed, subcutaneous tissue was closed utilizing 3-0 Monocryl in simple standard fashion.  Skin edges were closed utilizing 4-0 Monocryl.  Sterile dressings were applied utilizing Xeroform, 4 x 4's, web roll, thumb spica splint utilizing plaster and Ace wrap.   Fingertips were pink with brisk capillary refill after deflation of tourniquet.  The operative drapes were broken down.  Sling was applied.  The patient was awoken from anesthesia safely and taken to PACU in stable condition.    Post-operative plan: The patient will recover in the post-anesthesia care unit and then be discharged home.  The patient will be non weight bearing on the right upper extremity in a thumb spica splint.   I will see the patient back in the office in 2 weeks for postoperative followup.  Discharge instructions provided for appropriate dressing care and pain control.     Mikaela Hilgeman, MD Electronically signed, 12/06/23

## 2023-12-06 NOTE — Anesthesia Postprocedure Evaluation (Signed)
 Anesthesia Post Note  Patient: Laura Parker  Procedure(s) Performed: RIGHT THUMB ULNAR COLLATERAL LIGAMENT RECONSTRUCTION WITH LOCAL TISSUE AND INTERNAL BRACE (Right: Thumb)     Patient location during evaluation: PACU Anesthesia Type: Regional Level of consciousness: awake Pain management: pain level controlled Vital Signs Assessment: post-procedure vital signs reviewed and stable Respiratory status: spontaneous breathing, nonlabored ventilation and respiratory function stable Cardiovascular status: blood pressure returned to baseline and stable Postop Assessment: no apparent nausea or vomiting Anesthetic complications: no   No notable events documented.  Last Vitals:  Vitals:   12/06/23 1039 12/06/23 1052  BP: 126/74 123/79  Pulse: 69 61  Resp: 14 16  Temp:  (!) 36.4 C  SpO2: 97% 98%    Last Pain:  Vitals:   12/06/23 1052  TempSrc:   PainSc: 0-No pain                 Laura Parker

## 2023-12-06 NOTE — Progress Notes (Signed)
 Assisted Dr. Bradley Ferris with right, supraclavicular, ultrasound guided block. Side rails up, monitors on throughout procedure. See vital signs in flow sheet. Tolerated Procedure well.

## 2023-12-06 NOTE — Anesthesia Procedure Notes (Signed)
 Anesthesia Regional Block: Supraclavicular block   Pre-Anesthetic Checklist: , timeout performed,  Correct Patient, Correct Site, Correct Laterality,  Correct Procedure, Correct Position, site marked,  Risks and benefits discussed,  Surgical consent,  Pre-op evaluation,  At surgeon's request and post-op pain management  Laterality: Right  Prep: chloraprep       Needles:  Injection technique: Single-shot  Needle Type: Echogenic Stimulator Needle     Needle Length: 9cm  Needle Gauge: 21     Additional Needles:   Procedures:,,,, ultrasound used (permanent image in chart),,    Narrative:  Start time: 12/06/2023 8:25 AM End time: 12/06/2023 8:35 AM Injection made incrementally with aspirations every 5 mL.  Performed by: Personally  Anesthesiologist: Patrisha Bernardino SQUIBB, MD  Additional Notes: Functioning IV was confirmed and monitors were applied.  A timeout was performed. Sterile prep, hand hygiene and sterile gloves were used. A 90mm 21ga Arrow echogenic stimulator needle was used. Negative aspiration and negative test dose prior to incremental administration of local anesthetic. The patient tolerated the procedure well.  Ultrasound guidance: relevent anatomy identified, needle position confirmed, local anesthetic spread visualized around nerve(s), vascular puncture avoided.  Image printed for medical record.

## 2023-12-06 NOTE — Transfer of Care (Signed)
 Immediate Anesthesia Transfer of Care Note  Patient: Laura Parker  Procedure(s) Performed: RIGHT THUMB ULNAR COLLATERAL LIGAMENT RECONSTRUCTION WITH LOCAL TISSUE AND INTERNAL BRACE (Right: Thumb)  Patient Location: PACU  Anesthesia Type:MAC combined with regional for post-op pain  Level of Consciousness: awake, alert , and oriented  Airway & Oxygen Therapy: Patient Spontanous Breathing and Patient connected to face mask oxygen  Post-op Assessment: Report given to RN and Post -op Vital signs reviewed and stable  Post vital signs: Reviewed and stable  Last Vitals:  Vitals Value Taken Time  BP 120/98 12/06/23 10:24  Temp    Pulse 63 12/06/23 10:25  Resp 10 12/06/23 10:25  SpO2 100 % 12/06/23 10:25  Vitals shown include unfiled device data.  Last Pain:  Vitals:   12/06/23 0818  TempSrc: Temporal  PainSc: 3       Patients Stated Pain Goal: 3 (12/06/23 0818)  Complications: No notable events documented.

## 2023-12-06 NOTE — Interval H&P Note (Signed)
 History and Physical Interval Note:  12/06/2023 8:07 AM  Laura Parker Handler  has presented today for surgery, with the diagnosis of RIGHT THUMB ULNAR COLLATERAL LIGAMENT TEAR WITH AVULSION FRACTURE.  The various methods of treatment have been discussed with the patient and family. After consideration of risks, benefits and other options for treatment, the patient has consented to  Procedure(s) with comments: RECONSTRUCTION, LIGAMENT, COLLATERAL, MCP JOINT (Right) - RIGHT THUMB ULNAR COLLATERAL LIGAMENT RECONSTRUCTION WITH LOCAL TISSUE AND INTERNAL BRACE as a surgical intervention.  The patient's history has been reviewed, patient examined, no change in status, stable for surgery.  I have reviewed the patient's chart and labs.  Questions were answered to the patient's satisfaction.     [name]

## 2023-12-09 ENCOUNTER — Ambulatory Visit

## 2023-12-09 ENCOUNTER — Encounter (HOSPITAL_BASED_OUTPATIENT_CLINIC_OR_DEPARTMENT_OTHER): Payer: Self-pay | Admitting: Orthopedic Surgery

## 2023-12-09 NOTE — Therapy (Incomplete)
 OUTPATIENT OCCUPATIONAL THERAPY ORTHO EVALUATION  Patient Name: Laura Parker MRN: [id number] DOB:12-Jan-2001, 23 y.o., female Today's Date: 12/09/2023  PCP: [facility] REFERRING PROVIDER: Arlinda Buster, MD  END OF SESSION:   Past Medical History:  Diagnosis Date   Depression    Past Surgical History:  Procedure Laterality Date   ORIF RADIAL FRACTURE Right 05/20/2023   Procedure: ORIF RIGHT RADIUS AND ULNA FRACTURE, IRRIGATION AND CLOSURE OF HAND LACERATION;  Surgeon: Georgina Ozell LABOR, MD;  Location: [facility] OR;  Service: Orthopedics;  Laterality: Right;  IRRIGATION AND DEBRIDMENT OF RIGHT FOREARM   Patient Active Problem List   Diagnosis Date Noted   Rupture of ulnar collateral ligament of right thumb 12/06/2023   Forearm fractures, both bones, closed, right, initial encounter 05/20/2023   Laceration of skin of right hand 05/20/2023   Severe episode of recurrent major depressive disorder, without psychotic features (HCC)     ONSET DATE: 12/02/23 referral date, 12/06/23 surgery date  REFERRING DIAG: D36.358J (ICD-10-CM) - Gamekeeper's thumb of right hand, initial encounter  Received R thumb UCL reconstruction with internal brace augmentation on 12/06/23.  REFER TO IHP VOL 2 PG [id number]  THERAPY DIAG:  No diagnosis found.  Rationale for Evaluation and Treatment: {HABREHAB:27488}  SUBJECTIVE:   SUBJECTIVE STATEMENT: *** Pt accompanied by: {accompnied:27141}  PERTINENT HISTORY: ***  PRECAUTIONS: {Therapy precautions:24002}  RED FLAGS: {PT Red Flags:29287}   WEIGHT BEARING RESTRICTIONS: {Yes ***/No:24003}  PAIN:  Are you having pain? {OPRCPAIN:27236}  FALLS: Has patient fallen in last 6 months? {fallsyesno:27318}  LIVING ENVIRONMENT: Lives with: {OPRC lives with:25569::lives with their family} Lives in: {Lives in:25570} Stairs: {opstairs:27293} Has following equipment at home: {Assistive devices:23999}  PLOF:  {PLOF:24004}  PATIENT GOALS: ***  NEXT MD VISIT: ***  OBJECTIVE:  Note: Objective measures were completed at Evaluation unless otherwise noted.  HAND DOMINANCE: {MISC; OT HAND [phone]}  ADLs: {ADLs OT:31716}  FUNCTIONAL OUTCOME MEASURES: {OTFUNCTIONALMEASURES:27238}  UPPER EXTREMITY ROM:     {AROM/PROM:27142} ROM Right eval Left eval  Shoulder flexion    Shoulder abduction    Shoulder adduction    Shoulder extension    Shoulder internal rotation    Shoulder external rotation    Elbow flexion    Elbow extension    Wrist flexion    Wrist extension    Wrist ulnar deviation    Wrist radial deviation    Wrist pronation    Wrist supination    (Blank rows = not tested)  {AROM/PROM:27142} ROM Right eval Left eval  Thumb MCP (0-60)    Thumb IP (0-80)    Thumb Radial abd/add (0-55)     Thumb Palmar abd/add (0-45)     Thumb Opposition to Small Finger     Index MCP (0-90)     Index PIP (0-100)     Index DIP (0-70)      Long MCP (0-90)      Long PIP (0-100)      Long DIP (0-70)      Ring MCP (0-90)      Ring PIP (0-100)      Ring DIP (0-70)      Little MCP (0-90)      Little PIP (0-100)      Little DIP (0-70)      (Blank rows = not tested)   UPPER EXTREMITY MMT:     MMT Right eval Left eval  Shoulder flexion    Shoulder abduction    Shoulder adduction  Shoulder extension    Shoulder internal rotation    Shoulder external rotation    Middle trapezius    Lower trapezius    Elbow flexion    Elbow extension    Wrist flexion    Wrist extension    Wrist ulnar deviation    Wrist radial deviation    Wrist pronation    Wrist supination    (Blank rows = not tested)  HAND FUNCTION: {handfunction:27230}  COORDINATION: {otcoordination:27237}  SENSATION: {sensation:27233}  EDEMA: ***  COGNITION: Overall cognitive status: {cognition:24006} Areas of impairment: {impairedcognition:27234}  OBSERVATIONS: ***   TREATMENT DATE: ***                                                                                                                             Modalities: {OPRCMODALITIES:31717}     PATIENT EDUCATION: Education details: *** Person educated: {Person educated:25204} Education method: {Education Method:25205} Education comprehension: {Education Comprehension:25206}  HOME EXERCISE PROGRAM: ***  GOALS: Goals reviewed with patient? {yes/no:20286}  SHORT TERM GOALS: Target date: ***  *** Baseline: Goal status: INITIAL  2.  *** Baseline:  Goal status: INITIAL  3.  *** Baseline:  Goal status: INITIAL  4.  *** Baseline:  Goal status: INITIAL  5.  *** Baseline:  Goal status: INITIAL  6.  *** Baseline:  Goal status: INITIAL  LONG TERM GOALS: Target date: ***  *** Baseline:  Goal status: INITIAL  2.  *** Baseline:  Goal status: INITIAL  3.  *** Baseline:  Goal status: INITIAL  4.  *** Baseline:  Goal status: INITIAL  5.  *** Baseline:  Goal status: INITIAL  6.  *** Baseline:  Goal status: INITIAL  ASSESSMENT:  CLINICAL IMPRESSION: Patient is a 23 y.o. female who was seen today for occupational therapy evaluation for R gamekeeper's thumb with s/p R thumb UCL reconstruction with internal brace augmentation. Hx includes major depressive disorder, skin lacerations of R hand, forearm fx, . Patient currently presents *** baseline level of functioning demonstrating functional deficits and impairments as noted below. Pt would benefit from skilled OT services in the outpatient setting to work on impairments as noted below to help pt return to PLOF as able.     PERFORMANCE DEFICITS: in functional skills including {OT physical skills:25468}, cognitive skills including {OT cognitive skills:25469}, and psychosocial skills including {OT psychosocial skills:25470}.   IMPAIRMENTS: are limiting patient from {OT performance deficits:25471}.   COMORBIDITIES: {Comorbidities:25485} that  affects occupational performance. Patient will benefit from skilled OT to address above impairments and improve overall function.  MODIFICATION OR ASSISTANCE TO COMPLETE EVALUATION: {OT modification:25474}  OT OCCUPATIONAL PROFILE AND HISTORY: {OT PROFILE AND HISTORY:25484}  CLINICAL DECISION MAKING: {OT CDM:25475}  REHAB POTENTIAL: {rehabpotential:25112}  EVALUATION COMPLEXITY: {Evaluation complexity:25115}    12/09/23: Pt this date is currently 3 days postop  PLAN:  OT FREQUENCY: {rehab frequency:25116}  OT DURATION: {rehab duration:25117}  PLANNED INTERVENTIONS: {OT Interventions:25467}  RECOMMENDED OTHER SERVICES: ***  CONSULTED AND AGREED  WITH PLAN OF CARE: {ENR:74513}  PLAN FOR NEXT SESSION: ***   Rocky Dutch, OT 12/09/2023, 9:56 AM

## 2023-12-11 ENCOUNTER — Ambulatory Visit: Admitting: Occupational Therapy

## 2023-12-13 ENCOUNTER — Encounter: Payer: Self-pay | Admitting: Orthopedic Surgery

## 2023-12-16 ENCOUNTER — Other Ambulatory Visit: Payer: Self-pay | Admitting: Orthopedic Surgery

## 2023-12-16 ENCOUNTER — Encounter: Payer: Self-pay | Admitting: Radiology

## 2023-12-16 MED ORDER — OXYCODONE HCL 5 MG PO TABS: 5.0000 mg | ORAL_TABLET | Freq: Four times a day (QID) | ORAL | 0 refills | Status: DC | PRN

## 2023-12-19 ENCOUNTER — Ambulatory Visit: Admitting: Orthopedic Surgery

## 2023-12-19 ENCOUNTER — Other Ambulatory Visit: Payer: Self-pay

## 2023-12-19 DIAGNOSIS — S63641A Sprain of metacarpophalangeal joint of right thumb, initial encounter: Secondary | ICD-10-CM

## 2023-12-19 NOTE — Progress Notes (Signed)
   [name] - 23 y.o. female MRN [id number]  Date of birth: November 14, 2000  Office Visit Note: Visit Date: 12/19/2023 PCP: [facility] Referred by: [facility]  Subjective:  HPI: Laura Parker is a 23 y.o. female who presents today for follow up 2 weeks status post right thumb UCL reconstruction with internal brace.    Pertinent ROS were reviewed with the patient and found to be negative unless otherwise specified above in HPI.   Assessment & Plan: Visit Diagnoses: No diagnosis found.  Plan: Sutures removed today, wound is well-healing.  Thumb spica cast was applied.  Follow-up in 2 weeks for clinical recheck, will be seen by occupational therapy at that time for fabrication of a removable thumb spica orthosis.  Follow-up: No follow-ups on file.   Meds & Orders: No orders of the defined types were placed in this encounter.  No orders of the defined types were placed in this encounter.    Procedures: No procedures performed       Objective:   Vital Signs: LMP 10/19/2023 (Approximate) Comment: negative UPT 10/21  Ortho Exam Right thumb: - Well-healing incision along the ulnar border of the MCP, skin edges well-approximated without erythema or drainage - Gentle radial stress testing of the thumb at the MCP stable at both neutral and 30 degrees of flexion  Imaging: No results found.   Dante Cooter Afton Alderton, M.D. [facility], Hand Surgery

## 2023-12-25 ENCOUNTER — Other Ambulatory Visit: Payer: Self-pay | Admitting: Orthopedic Surgery

## 2023-12-25 MED ORDER — OXYCODONE HCL 5 MG PO TABS: 5.0000 mg | ORAL_TABLET | Freq: Four times a day (QID) | ORAL | 0 refills | Status: DC | PRN
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
## 2024-01-01 ENCOUNTER — Ambulatory Visit: Attending: Orthopedic Surgery | Admitting: Occupational Therapy

## 2024-01-01 ENCOUNTER — Ambulatory Visit (INDEPENDENT_AMBULATORY_CARE_PROVIDER_SITE_OTHER): Admitting: Orthopedic Surgery

## 2024-01-01 ENCOUNTER — Telehealth: Payer: Self-pay | Admitting: Orthopedic Surgery

## 2024-01-01 DIAGNOSIS — S63641A Sprain of metacarpophalangeal joint of right thumb, initial encounter: Secondary | ICD-10-CM | POA: Insufficient documentation

## 2024-01-01 DIAGNOSIS — M25631 Stiffness of right wrist, not elsewhere classified: Secondary | ICD-10-CM | POA: Diagnosis present

## 2024-01-01 DIAGNOSIS — M25541 Pain in joints of right hand: Secondary | ICD-10-CM | POA: Diagnosis present

## 2024-01-01 DIAGNOSIS — R600 Localized edema: Secondary | ICD-10-CM | POA: Insufficient documentation

## 2024-01-01 DIAGNOSIS — M25649 Stiffness of unspecified hand, not elsewhere classified: Secondary | ICD-10-CM | POA: Insufficient documentation

## 2024-01-01 DIAGNOSIS — M6281 Muscle weakness (generalized): Secondary | ICD-10-CM | POA: Insufficient documentation

## 2024-01-01 DIAGNOSIS — R278 Other lack of coordination: Secondary | ICD-10-CM | POA: Insufficient documentation

## 2024-01-01 DIAGNOSIS — S63649A Sprain of metacarpophalangeal joint of unspecified thumb, initial encounter: Secondary | ICD-10-CM

## 2024-01-01 NOTE — Telephone Encounter (Signed)
 Patient called and said that she needs an appointment f/u schedule from 11/06 for 2 wk f/u. [phone]

## 2024-01-01 NOTE — Patient Instructions (Addendum)
 AROM: Wrist Extension    With right palm down, bend wrist up. Repeat _10___ times per set. Do 4-6 sessions per day    Opposition (Active)   Touch tip of thumb to nail tip of each finger in turn, making an O shape. Repeat __10__ times. Do _4-6___ sessions per day.   AROM: Thumb Abduction / Adduction    Actively pull right thumb away from palm 50% motion. Do NOT FORCE.  Then bring thumb back to touch fingers. Try not to bend fingers toward thumb. Repeat _10-15___ times per set.  Do __4-6__ sessions per day.    MP Flexion (Active)   Bend thumb to touch base of little finger, keeping tip joint straight. Repeat __10-15__ times. Do _4-6___ sessions per day    Composite Extension (Active)   Bring thumb up and out in hitchhiker position 50%. DO NOT FORCE.  Repeat __10-15__ times. Do _4-6___ sessions per day.   IP Flexion (Active Blocked)   Brace thumb below tip joint. Bend joint as far as possible. Repeat __10__ times. Do _4-6___ sessions per day.

## 2024-01-01 NOTE — Progress Notes (Signed)
   Laura Parker - 23 y.o. female MRN [id number]  Date of birth: 2000-10-21  Office Visit Note: Visit Date: 01/01/2024 PCP: [facility], [facility] Referred by: [facility], [facility]  Subjective:  HPI: Laura Parker is a 23 y.o. female who presents today for follow up 4 weeks status post right thumb UCL reconstruction with internal brace.    Pertinent ROS were reviewed with the patient and found to be negative unless otherwise specified above in HPI.   Assessment & Plan: Visit Diagnoses: No diagnosis found.  Plan: She is doing well overall.  Thumb spica cast removed today.  She will be seen by occupational therapy today for fabrication of a removable thumb spica orthosis and begin appropriate postoperative protocol.  Follow-up with myself in approximately 1 month.  Follow-up: No follow-ups on file.   Meds & Orders: No orders of the defined types were placed in this encounter.  No orders of the defined types were placed in this encounter.    Procedures: No procedures performed       Objective:   Vital Signs: LMP 10/19/2023 (Approximate) Comment: negative UPT 10/21  Ortho Exam Right thumb: - Well-healing incision along the ulnar border of the MCP, skin edges well-approximated without erythema or drainage - Gentle radial stress testing of the thumb at the MCP stable at both neutral and 30 degrees of flexion  Imaging: No results found.   Artice Bergerson Afton Alderton, M.D. [facility], Hand Surgery

## 2024-01-15 ENCOUNTER — Ambulatory Visit: Admitting: Occupational Therapy

## 2024-01-20 ENCOUNTER — Telehealth: Payer: Self-pay | Admitting: Occupational Therapy

## 2024-01-20 ENCOUNTER — Ambulatory Visit: Admitting: Occupational Therapy

## 2024-01-20 DIAGNOSIS — M25541 Pain in joints of right hand: Secondary | ICD-10-CM | POA: Insufficient documentation

## 2024-01-20 DIAGNOSIS — M25649 Stiffness of unspecified hand, not elsewhere classified: Secondary | ICD-10-CM | POA: Insufficient documentation

## 2024-01-20 DIAGNOSIS — M79644 Pain in right finger(s): Secondary | ICD-10-CM | POA: Insufficient documentation

## 2024-01-20 DIAGNOSIS — M25641 Stiffness of right hand, not elsewhere classified: Secondary | ICD-10-CM | POA: Insufficient documentation

## 2024-01-20 DIAGNOSIS — R278 Other lack of coordination: Secondary | ICD-10-CM | POA: Insufficient documentation

## 2024-01-20 DIAGNOSIS — M25631 Stiffness of right wrist, not elsewhere classified: Secondary | ICD-10-CM | POA: Insufficient documentation

## 2024-01-20 DIAGNOSIS — R600 Localized edema: Secondary | ICD-10-CM | POA: Insufficient documentation

## 2024-01-20 DIAGNOSIS — M6281 Muscle weakness (generalized): Secondary | ICD-10-CM | POA: Insufficient documentation

## 2024-01-20 NOTE — Telephone Encounter (Signed)
 Called and left message re: today's missed O.T. appointment and reminded patient of next appointment this Wednesday 01/22/24

## 2024-01-22 ENCOUNTER — Ambulatory Visit: Admitting: Occupational Therapy

## 2024-01-22 ENCOUNTER — Encounter: Payer: Self-pay | Admitting: Occupational Therapy

## 2024-01-22 DIAGNOSIS — M25541 Pain in joints of right hand: Secondary | ICD-10-CM

## 2024-01-22 DIAGNOSIS — R600 Localized edema: Secondary | ICD-10-CM

## 2024-01-22 DIAGNOSIS — M25631 Stiffness of right wrist, not elsewhere classified: Secondary | ICD-10-CM

## 2024-01-22 DIAGNOSIS — M25649 Stiffness of unspecified hand, not elsewhere classified: Secondary | ICD-10-CM

## 2024-01-22 DIAGNOSIS — R278 Other lack of coordination: Secondary | ICD-10-CM

## 2024-01-22 DIAGNOSIS — M79644 Pain in right finger(s): Secondary | ICD-10-CM | POA: Diagnosis present

## 2024-01-22 DIAGNOSIS — M6281 Muscle weakness (generalized): Secondary | ICD-10-CM

## 2024-01-22 DIAGNOSIS — M25641 Stiffness of right hand, not elsewhere classified: Secondary | ICD-10-CM | POA: Diagnosis present
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
## 2024-01-27 ENCOUNTER — Emergency Department (HOSPITAL_COMMUNITY)
Admission: EM | Admit: 2024-01-27 | Discharge: 2024-01-28 | Discharge status: Against Medical Advice | Attending: Emergency Medicine | Admitting: Emergency Medicine

## 2024-01-27 ENCOUNTER — Encounter (HOSPITAL_COMMUNITY): Payer: Self-pay

## 2024-01-27 ENCOUNTER — Other Ambulatory Visit: Payer: Self-pay

## 2024-01-27 DIAGNOSIS — R1012 Left upper quadrant pain: Secondary | ICD-10-CM | POA: Diagnosis not present

## 2024-01-27 DIAGNOSIS — R1110 Vomiting, unspecified: Secondary | ICD-10-CM | POA: Diagnosis not present

## 2024-01-27 DIAGNOSIS — R109 Unspecified abdominal pain: Secondary | ICD-10-CM | POA: Diagnosis present

## 2024-01-27 DIAGNOSIS — Z5321 Procedure and treatment not carried out due to patient leaving prior to being seen by health care provider: Secondary | ICD-10-CM | POA: Diagnosis not present

## 2024-01-27 LAB — CBG MONITORING, ED: Glucose-Capillary: 82 mg/dL (ref 70–99)

## 2024-01-27 MED ORDER — ONDANSETRON 4 MG PO TBDP
4.0000 mg | ORAL_TABLET | Freq: Once | ORAL | Status: AC | PRN
  Administered 2024-01-27: 22:00:00 4 mg via ORAL
  Filled 2024-01-27: qty 1

## 2024-01-27 MED ORDER — HYDROCODONE-ACETAMINOPHEN 5-325 MG PO TABS
1.0000 | ORAL_TABLET | Freq: Once | ORAL | Status: AC
  Administered 2024-01-27: 22:00:00 1 via ORAL
  Filled 2024-01-27: qty 1

## 2024-01-27 NOTE — ED Triage Notes (Signed)
 Pt reports that she has not passed gas all day or had a bowl movement

## 2024-01-27 NOTE — ED Provider Triage Note (Signed)
 Emergency Medicine Provider Triage Evaluation Note  Laura Parker , a 22 y.o. female  was evaluated in triage.  Pt complains of abdominal pain and vomiting. Pain started this morning and is progressively worse, now having vomiting. Has not passed any gas or had a BM.  Review of Systems  Positive: Abdominal pain, vomiting Negative: Urinary or vaginal symptoms  Physical Exam  BP 135/87 (BP Location: Left Arm)   Pulse 83   Temp 98.5 F (36.9 C)   Resp 18   Ht 5' 3 (1.6 m)   Wt 73.3 kg   LMP  (LMP Unknown)   SpO2 100%   BMI 28.63 kg/m  Gen:   Awake, no distress Resp:  Normal effort Abd:   LUQ tenderness  Medical Decision Making  Medically screening exam initiated at 9:50 PM.  Appropriate orders placed.  Laura Parker was informed that the remainder of the evaluation will be completed by another provider, this initial triage assessment does not replace that evaluation, and the importance of remaining in the ED until their evaluation is complete.  Labs, CT. Will give zofran , hydrocodone .   Freddi Hamilton, MD 01/27/24 2152

## 2024-01-27 NOTE — ED Triage Notes (Signed)
 Pt coming in from home reporting abdominal pain, nausea  and vomiting. Pt reports that it started this morning. Pt reports left lower quadrant pain.  Bp 122/84 Hr 86 Rr 20  99%ra  Cbg 76

## 2024-01-28 ENCOUNTER — Emergency Department (HOSPITAL_COMMUNITY)

## 2024-01-28 ENCOUNTER — Telehealth: Payer: Self-pay | Admitting: Occupational Therapy

## 2024-01-28 ENCOUNTER — Ambulatory Visit: Admitting: Occupational Therapy

## 2024-01-28 LAB — COMPREHENSIVE METABOLIC PANEL WITH GFR
ALT: 12 U/L (ref 0–44)
AST: 15 U/L (ref 15–41)
Albumin: 3.8 g/dL (ref 3.5–5.0)
Alkaline Phosphatase: 39 U/L (ref 38–126)
Anion gap: 10 (ref 5–15)
BUN: 10 mg/dL (ref 6–20)
CO2: 24 mmol/L (ref 22–32)
Calcium: 9.3 mg/dL (ref 8.9–10.3)
Chloride: 103 mmol/L (ref 98–111)
Creatinine, Ser: 0.75 mg/dL (ref 0.44–1.00)
GFR, Estimated: >60 mL/min (ref >60)
Glucose, Bld: 117 mg/dL — ABNORMAL HIGH (ref 70–99)
Potassium: 3.8 mmol/L (ref 3.5–5.1)
Sodium: 137 mmol/L (ref 135–145)
Total Bilirubin: 0.6 mg/dL (ref 0.0–1.2)
Total Protein: 7 g/dL (ref 6.5–8.1)

## 2024-01-28 LAB — URINALYSIS, ROUTINE W REFLEX MICROSCOPIC
Bacteria, UA: NONE SEEN
Bilirubin Urine: NEGATIVE
Glucose, UA: NEGATIVE mg/dL
Ketones, ur: 20 mg/dL — AB
Leukocytes,Ua: NEGATIVE
Nitrite: NEGATIVE
Protein, ur: NEGATIVE mg/dL
Specific Gravity, Urine: 1.026 (ref 1.005–1.030)
pH: 5 (ref 5.0–8.0)

## 2024-01-28 LAB — CBC
HCT: 40.5 % (ref 36.0–46.0)
Hemoglobin: 13.6 g/dL (ref 12.0–15.0)
MCH: 31.9 pg (ref 26.0–34.0)
MCHC: 33.6 g/dL (ref 30.0–36.0)
MCV: 95.1 fL (ref 80.0–100.0)
Platelets: 247 K/uL (ref 150–400)
RBC: 4.26 MIL/uL (ref 3.87–5.11)
RDW: 13 % (ref 11.5–15.5)
WBC: 14 K/uL — ABNORMAL HIGH (ref 4.0–10.5)
nRBC: 0 % (ref 0.0–0.2)

## 2024-01-28 LAB — LIPASE, BLOOD: Lipase: 31 U/L (ref 11–51)

## 2024-01-28 LAB — HCG, SERUM, QUALITATIVE: Preg, Serum: NEGATIVE

## 2024-01-28 MED ORDER — IOHEXOL 350 MG/ML SOLN
75.0000 mL | Freq: Once | INTRAVENOUS | Status: AC | PRN
  Administered 2024-01-28: 02:00:00 75 mL via INTRAVENOUS

## 2024-01-28 NOTE — Telephone Encounter (Signed)
 Called and spoke to patient re: 2nd no show for O.T. appointment today. Pt states she wasn't feeling well, however reminded patient that she needs to call and cancel appointment if sick or cannot make her appointment for any reason, as her next no show will result in clinic cancelling remaining appointments per no show policy. Pt verbalized understanding. Pt was reminded of her next appointment on Thursday 01/30/24.

## 2024-01-28 NOTE — ED Notes (Signed)
 Pt stated she was leaving. IV removed by this tech

## 2024-01-29 ENCOUNTER — Encounter (HOSPITAL_COMMUNITY): Payer: Self-pay

## 2024-01-29 ENCOUNTER — Ambulatory Visit (HOSPITAL_COMMUNITY)
Admission: EM | Admit: 2024-01-29 | Discharge: 2024-01-29 | Disposition: A | Discharge status: Home / Self Care | Source: Home / Self Care

## 2024-01-29 DIAGNOSIS — Z79899 Other long term (current) drug therapy: Secondary | ICD-10-CM | POA: Insufficient documentation

## 2024-01-29 DIAGNOSIS — R1024 Suprapubic pain: Secondary | ICD-10-CM

## 2024-01-29 DIAGNOSIS — Z3202 Encounter for pregnancy test, result negative: Secondary | ICD-10-CM | POA: Diagnosis not present

## 2024-01-29 LAB — POCT URINE DIPSTICK
Glucose, UA: NEGATIVE mg/dL
Leukocytes, UA: NEGATIVE
Nitrite, UA: NEGATIVE
POC PROTEIN,UA: 30 — AB
Spec Grav, UA: 1.025 (ref 1.010–1.025)
Urobilinogen, UA: 0.2 U/dL
pH, UA: 6 (ref 5.0–8.0)

## 2024-01-29 LAB — POCT URINE PREGNANCY: Preg Test, Ur: NEGATIVE

## 2024-01-29 MED ORDER — ONDANSETRON 4 MG PO TBDP: 4.0000 mg | ORAL_TABLET | Freq: Three times a day (TID) | ORAL | 0 refills | Status: AC | PRN

## 2024-01-29 MED ORDER — PHENAZOPYRIDINE HCL 200 MG PO TABS: 200.0000 mg | ORAL_TABLET | Freq: Three times a day (TID) | ORAL | 0 refills | Status: AC

## 2024-01-29 MED ORDER — POLYETHYLENE GLYCOL 3350 17 G PO PACK: 17.0000 g | PACK | Freq: Every day | ORAL | 0 refills | Status: AC

## 2024-01-29 NOTE — ED Provider Notes (Signed)
 [facility]  Note:  This document was prepared using Conservation officer, historic buildings and may include unintentional dictation errors.  MRN: [id number] DOB: 01-Nov-2000  Subjective:   Laura Parker is a 23 y.o. female presenting for lower abdominal pain/pressure x 2 days.  Patient reports recent constipation, intermittent nausea, no vomiting.  Patient reports last bowel movement was 2 to 3 days ago.  Patient also has severely decreased appetite over the last day.  Patient states she tried to eat a pork chop yesterday but felt nauseous afterwards and could not finish meal.  Patient denies any known sick contacts.  No shortness of breath, chest pain, weakness, dizziness, severe abdominal pain, flank pain, fever.  Current Medications[1]   Allergies[2]  Past Medical History:  Diagnosis Date   Depression      Past Surgical History:  Procedure Laterality Date   ORIF RADIAL FRACTURE Right 05/20/2023   Procedure: ORIF RIGHT RADIUS AND ULNA FRACTURE, IRRIGATION AND CLOSURE OF HAND LACERATION;  Surgeon: Georgina Ozell LABOR, MD;  Location: [facility] OR;  Service: Orthopedics;  Laterality: Right;  IRRIGATION AND DEBRIDMENT OF RIGHT FOREARM   RECONSTRUCTION, LIGAMENT, COLLATERAL, MCP JOINT Right 12/06/2023   Procedure: RIGHT THUMB ULNAR COLLATERAL LIGAMENT RECONSTRUCTION WITH LOCAL TISSUE AND INTERNAL BRACE;  Surgeon: Arlinda Buster, MD;  Location: [facility];  Service: Orthopedics;  Laterality: Right;    History reviewed. No pertinent family history.  Social History[3]  ROS Refer to HPI for ROS details.  Objective:    Vitals: BP 104/68 (BP Location: Left Arm)   Pulse 82   Temp 99 F (37.2 C) (Oral)   Resp 16   LMP 01/25/2024 (Approximate)   SpO2 95%   Physical Exam Vitals and nursing note reviewed.  Constitutional:      General: She is not in acute distress.    Appearance: Normal appearance. She is well-developed. She is not ill-appearing or  toxic-appearing.  HENT:     Head: Normocephalic and atraumatic.  Cardiovascular:     Rate and Rhythm: Normal rate.  Pulmonary:     Effort: Pulmonary effort is normal. No respiratory distress.     Breath sounds: No stridor. No wheezing.  Abdominal:     General: There is no distension.     Palpations: Abdomen is soft.     Tenderness: There is abdominal tenderness. There is no right CVA tenderness or left CVA tenderness.  Skin:    General: Skin is warm and dry.  Neurological:     General: No focal deficit present.     Mental Status: She is alert and oriented to person, place, and time.  Psychiatric:        Mood and Affect: Mood normal.        Behavior: Behavior normal.     Procedures  Results for orders placed or performed during the hospital encounter of 01/29/24 (from the past 24 hours)  POC Urinalysis Dipstick     Status: Abnormal   Collection Time: 01/29/24 10:08 AM  Result Value Ref Range   Color, UA straw (A) yellow   Clarity, UA clear clear   Glucose, UA negative negative mg/dL   Bilirubin, UA moderate (A) negative   Ketones, POC UA >= (160) (A) negative mg/dL   Spec Grav, UA 8.974 8.989 - 1.025   Blood, UA small (A) negative   pH, UA 6.0 5.0 - 8.0   POC PROTEIN,UA =30 (A) negative, trace   Urobilinogen, UA 0.2 0.2 or 1.0 E.U./dL  Nitrite, UA Negative Negative   Leukocytes, UA Negative Negative  POCT urine pregnancy     Status: None   Collection Time: 01/29/24 10:08 AM  Result Value Ref Range   Preg Test, Ur Negative Negative    Assessment and Plan :     Discharge Instructions       1. Abdominal pain, suprapubic (Primary) - POC Urinalysis Dipstick completed in UC shows moderate bilirubin, small blood, no nitrite, no leukocytes, no significant sign of urinary infection.  Will begin empiric treatment with Pyridium  until urine culture is completed - POCT urine pregnancy complete in UC is negative - Urine Culture collected in UC and sent to lab for further  testing results should be available in 2 to 3 days - phenazopyridine  (PYRIDIUM ) 200 MG tablet; Take 1 tablet (200 mg total) by mouth 3 (three) times daily.  Dispense: 6 tablet; Refill: 0  -Continue to monitor symptoms for any change in severity if there is any escalation of current symptoms or development of new symptoms follow-up in ER for further evaluation and management.     Saher Davee B Babita Amaker    [1] No current facility-administered medications for this encounter.  Current Outpatient Medications:    ondansetron  (ZOFRAN -ODT) 4 MG disintegrating tablet, Take 1 tablet (4 mg total) by mouth every 8 (eight) hours as needed for nausea or vomiting., Disp: 20 tablet, Rfl: 0   phenazopyridine  (PYRIDIUM ) 200 MG tablet, Take 1 tablet (200 mg total) by mouth 3 (three) times daily., Disp: 6 tablet, Rfl: 0   polyethylene glycol (MIRALAX ) 17 g packet, Take 17 g by mouth daily., Disp: 14 each, Rfl: 0   acetaminophen  (TYLENOL ) 325 MG tablet, Take 650 mg by mouth every 6 (six) hours as needed for mild pain (pain score 1-3)., Disp: , Rfl:  [2]  Allergies Allergen Reactions   Chlorine   [3]  Social History Tobacco Use   Smoking status: Every Day    Types: Cigars   Smokeless tobacco: Never  Vaping Use   Vaping status: Never Used  Substance Use Topics   Alcohol use: Yes    Comment: occasionally   Drug use: Yes    Types: Marijuana     Aurea Goodell B, NP 01/29/24 1108

## 2024-01-29 NOTE — ED Triage Notes (Signed)
 Patient here today with c/o lower abd pain X 2 days. Patient has taken Tylenol  with some relief.

## 2024-01-29 NOTE — Discharge Instructions (Signed)
°  1. Abdominal pain, suprapubic (Primary) - POC Urinalysis Dipstick completed in UC shows moderate bilirubin, small blood, no nitrite, no leukocytes, no significant sign of urinary infection.  Will begin empiric treatment with Pyridium  until urine culture is completed - POCT urine pregnancy complete in UC is negative - Urine Culture collected in UC and sent to lab for further testing results should be available in 2 to 3 days - phenazopyridine  (PYRIDIUM ) 200 MG tablet; Take 1 tablet (200 mg total) by mouth 3 (three) times daily.  Dispense: 6 tablet; Refill: 0  -Continue to monitor symptoms for any change in severity if there is any escalation of current symptoms or development of new symptoms follow-up in ER for further evaluation and management.

## 2024-01-30 ENCOUNTER — Ambulatory Visit (HOSPITAL_COMMUNITY): Payer: Self-pay

## 2024-01-30 ENCOUNTER — Ambulatory Visit: Admitting: Occupational Therapy

## 2024-01-30 LAB — URINE CULTURE
Culture: NO GROWTH
Special Requests: NORMAL

## 2024-01-31 ENCOUNTER — Telehealth: Payer: Self-pay | Admitting: Orthopedic Surgery

## 2024-01-31 NOTE — Telephone Encounter (Signed)
 Received call from patient. She is requesting extension on leave. ROV is 02/17/24, she states it is scheduled past 4 weeks due to availability. Please advise. Pts callback [phone]

## 2024-02-03 ENCOUNTER — Ambulatory Visit: Admitting: Occupational Therapy

## 2024-02-03 ENCOUNTER — Telehealth: Payer: Self-pay | Admitting: Occupational Therapy

## 2024-02-03 NOTE — Telephone Encounter (Signed)
 This is to document my attempt to call patient after no-show for OT appt this PM.  This is patient's # 3rd missed/no-show appt, in addition to 2 other cancelled appts by pt.   Primary and Cell phone number(s) was used in efforts to contact the patient.   Voice mail was left requesting the patient call the clinic back at [phone] to confirm upcoming appointment and reminding them of the clinic's attendance policy.   OTR did leave appt on the schedule for 02/05/24 at 1:15PM but pt informed that if she no-shows again all future appts will be cancelled due to our policy.

## 2024-02-05 ENCOUNTER — Ambulatory Visit: Admitting: Occupational Therapy

## 2024-02-11 ENCOUNTER — Ambulatory Visit: Admitting: Occupational Therapy

## 2024-02-11 NOTE — Telephone Encounter (Signed)
 LMOM to schedule for 12/31

## 2024-02-17 ENCOUNTER — Ambulatory Visit (INDEPENDENT_AMBULATORY_CARE_PROVIDER_SITE_OTHER): Admitting: Orthopedic Surgery

## 2024-02-17 DIAGNOSIS — S63641A Sprain of metacarpophalangeal joint of right thumb, initial encounter: Secondary | ICD-10-CM

## 2024-02-17 DIAGNOSIS — S63649A Sprain of metacarpophalangeal joint of unspecified thumb, initial encounter: Secondary | ICD-10-CM

## 2024-02-17 NOTE — Progress Notes (Signed)
" ° °  Laura Parker - 24 y.o. female MRN [id number]  Date of birth: 2000/10/09  Office Visit Note: Visit Date: 02/17/2024 PCP: [facility] Referred by: [facility], [facility]  Subjective:  HPI: Laura Parker is a 24 y.o. female who presents today for follow up 10 weeks status post right thumb UCL reconstruction with internal brace.  Has been unable to do OT due to travel restrictions, has requested a new OT referral.  Has the thumb spica brace currently on still.  Pertinent ROS were reviewed with the patient and found to be negative unless otherwise specified above in HPI.   Assessment & Plan: Visit Diagnoses: No diagnosis found.  Plan: She demonstrates appropriate healing postoperatively.  I did reiterate for her the importance of OT moving forward in order to regain her strength and coordination of the thumb in a more timely fashion.  Her pain is well-controlled on examination today, the thumb is stable to stress testing.  New OT referral will be placed today.  She can continue without utilization of the right hand from work standpoint for additional 4 weeks, return at that time for recheck.  Follow-up: No follow-ups on file.   Meds & Orders: No orders of the defined types were placed in this encounter.  No orders of the defined types were placed in this encounter.    Procedures: No procedures performed       Objective:   Vital Signs: LMP 01/25/2024 (Approximate)   Ortho Exam Right thumb: - Well-healing incision along the ulnar border of the MCP, skin edges well-approximated without erythema or drainage - Gentle radial stress testing of the thumb at the MCP stable at both neutral and 30 degrees of flexion - Thumb pinch strength right 5, left 12 - Thumb opposition to small finger PIP  Imaging: No results found.   Anndee Connett Afton Alderton, M.D. [facility], Hand Surgery  "

## 2024-02-18 ENCOUNTER — Ambulatory Visit: Admitting: Occupational Therapy

## 2024-02-25 ENCOUNTER — Ambulatory Visit: Admitting: Occupational Therapy

## 2024-02-27 ENCOUNTER — Ambulatory Visit: Attending: Orthopedic Surgery | Admitting: Occupational Therapy

## 2024-03-16 ENCOUNTER — Ambulatory Visit: Admitting: Orthopedic Surgery
